# Patient Record
Sex: Female | Born: 2000 | Race: White | Hispanic: No | Marital: Single | Smoking: Never smoker
Health system: Southern US, Community
[De-identification: ages and names within clinical notes are randomized; demographics above are authoritative.]

## PROBLEM LIST (undated history)

## (undated) ENCOUNTER — Ambulatory Visit: Admission: EM

## (undated) DIAGNOSIS — R519 Headache, unspecified: Secondary | ICD-10-CM

## (undated) DIAGNOSIS — R51 Headache: Secondary | ICD-10-CM

## (undated) DIAGNOSIS — Z789 Other specified health status: Secondary | ICD-10-CM

## (undated) DIAGNOSIS — G43909 Migraine, unspecified, not intractable, without status migrainosus: Secondary | ICD-10-CM

## (undated) DIAGNOSIS — IMO0001 Reserved for inherently not codable concepts without codable children: Secondary | ICD-10-CM

## (undated) HISTORY — DX: Migraine, unspecified, not intractable, without status migrainosus: G43.909

---
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
## 2020-03-25 ENCOUNTER — Other Ambulatory Visit: Payer: Self-pay

## 2020-03-25 ENCOUNTER — Ambulatory Visit (INDEPENDENT_AMBULATORY_CARE_PROVIDER_SITE_OTHER): Payer: BC Managed Care – PPO | Admitting: Family Medicine

## 2020-03-25 ENCOUNTER — Encounter: Payer: Self-pay | Admitting: Family Medicine

## 2020-03-25 VITALS — BP 111/72 | HR 77 | Temp 98.6°F | Ht 69.3 in | Wt 231.6 lb

## 2020-03-25 DIAGNOSIS — Z13220 Encounter for screening for lipoid disorders: Secondary | ICD-10-CM | POA: Diagnosis not present

## 2020-03-25 DIAGNOSIS — Z111 Encounter for screening for respiratory tuberculosis: Secondary | ICD-10-CM | POA: Diagnosis not present

## 2020-03-25 DIAGNOSIS — Z862 Personal history of diseases of the blood and blood-forming organs and certain disorders involving the immune mechanism: Secondary | ICD-10-CM

## 2020-03-25 DIAGNOSIS — G43009 Migraine without aura, not intractable, without status migrainosus: Secondary | ICD-10-CM | POA: Diagnosis not present

## 2020-03-25 MED ORDER — SUMATRIPTAN SUCCINATE 100 MG PO TABS: 100.0000 mg | ORAL_TABLET | Freq: Every day | ORAL | 6 refills | Status: DC

## 2020-03-25 MED ORDER — TROKENDI XR 100 MG PO CP24: 100.0000 mg | ORAL_CAPSULE | Freq: Every day | ORAL | 3 refills | Status: DC

## 2020-03-25 NOTE — Assessment & Plan Note (Signed)
Chronic for the past few years. Continues with weekly migraines now with blurred vision, nausea and vomiting- worse than when they started. Not under good control on trokendi and imitrex. Will increase dose of both and recheck 1 month. Will obtain MRI of head to r/o mass or other issue given change in migraine. Call with any concerns. Continue to monitor.

## 2020-03-25 NOTE — Progress Notes (Signed)
BP 111/72   Pulse 77   Temp 98.6 F (37 C) (Oral)   Ht 5' 9.3" (1.76 m)   Wt 231 lb 9.6 oz (105.1 kg)   LMP 03/10/2020 (Approximate)   SpO2 98%   BMI 33.91 kg/m    Subjective:    Patient ID: Deborah Summers, female    DOB: [birth year], 19 y.o.   MRN: [id number]  HPI: Deborah Summers is a 19 y.o. female who presents today to establish care.   Chief Complaint  Patient presents with  . Migraine  . Establish Care   MIGRAINES- was on topiramate, then changed to trokendi over a year, just upped it about 3 months ago to the 50mg , has tingling in her hands and occasionally spasms in her hands, usually just the R hand, no pain Duration: 2.5 years Onset: sudden Severity: severe Quality: sharp, stabbing Frequency: 1x a week Location: L temple Headache duration: Radiation: no Time of day headache occurs: at random  Headache status at time of visit: asymptomatic Treatments attempted: Treatments attempted: rest, ice, heat, APAP, ibuprofen, aleve", excedrine, triptans and topamax   Aura: no Nausea:  yes Vomiting: yes Photophobia:  yes Phonophobia:  no Effect on social functioning:  yes Confusion:  no Gait disturbance/ataxia:  no Behavioral changes:  no Fevers:  no  Active Ambulatory Problems    Diagnosis Date Noted  . Migraine without aura 03/25/2020   Resolved Ambulatory Problems    Diagnosis Date Noted  . No Resolved Ambulatory Problems   Past Medical History:  Diagnosis Date  . Cold   . Headache   . Migraine   . Patient denies medical problems    Past Surgical History:  Procedure Laterality Date  . TONSILLECTOMY AND ADENOIDECTOMY Bilateral 10/28/2015   Procedure: TONSILLECTOMY AND ADENOIDECTOMY;  Surgeon: 10/30/2015, MD;  Location: [facility];  Service: ENT;  Laterality: Bilateral;   Outpatient Encounter Medications as of 03/25/2020  Medication Sig  . promethazine (PHENERGAN) 25 MG tablet Take 25 mg by mouth every 6 (six) hours as needed for nausea  or vomiting.  . SPRINTEC 28 0.25-35 MG-MCG tablet Take 1 tablet by mouth daily.  . SUMAtriptan (IMITREX) 100 MG tablet Take 1 tablet (100 mg total) by mouth daily. May repeat in 2 hours if headache persists or recurs.  . [DISCONTINUED] SUMAtriptan (IMITREX) 50 MG tablet SMARTSIG:1 Tablet(s) By Mouth 1 to 2 Times Daily  . [DISCONTINUED] TROKENDI XR 50 MG CP24 Take 1 capsule by mouth daily.  . Topiramate ER (TROKENDI XR) 100 MG CP24 Take 100 mg by mouth daily.  . [DISCONTINUED] HYDROcodone-acetaminophen (HYCET) 7.5-325 mg/15 ml solution Take 10 mLs by mouth every 4 (four) hours as needed for moderate pain.   No facility-administered encounter medications on file as of 03/25/2020.   Social History   Socioeconomic History  . Marital status: Single    Spouse name: Not on file  . Number of children: Not on file  . Years of education: Not on file  . Highest education level: Not on file  Occupational History  . Not on file  Tobacco Use  . Smoking status: Never Smoker  . Smokeless tobacco: Never Used  Vaping Use  . Vaping Use: Never used  Substance and Sexual Activity  . Alcohol use: No  . Drug use: No  . Sexual activity: Not Currently  Other Topics Concern  . Not on file  Social History Narrative  . Not on file   Social Determinants of Health  Financial Resource Strain:   . Difficulty of Paying Living Expenses: Not on file  Food Insecurity:   . Worried About Programme researcher, broadcasting/film/video in the Last Year: Not on file  . Ran Out of Food in the Last Year: Not on file  Transportation Needs:   . Lack of Transportation (Medical): Not on file  . Lack of Transportation (Non-Medical): Not on file  Physical Activity:   . Days of Exercise per Week: Not on file  . Minutes of Exercise per Session: Not on file  Stress:   . Feeling of Stress : Not on file  Social Connections:   . Frequency of Communication with Friends and Family: Not on file  . Frequency of Social Gatherings with Friends and  Family: Not on file  . Attends Religious Services: Not on file  . Active Member of Clubs or Organizations: Not on file  . Attends Banker Meetings: Not on file  . Marital Status: Not on file   Family History  Problem Relation Age of Onset  . Hypertension Mother   . Heart disease Father   . Anxiety disorder Brother   . Uterine cancer Maternal Grandmother   . Heart disease Maternal Grandfather   . Multiple sclerosis Paternal Grandmother   . Heart disease Paternal Grandfather    Review of Systems  Constitutional: Negative.   Respiratory: Negative.   Cardiovascular: Negative.   Gastrointestinal: Positive for nausea. Negative for abdominal distention, abdominal pain, anal bleeding, blood in stool, constipation, diarrhea, rectal pain and vomiting.  Musculoskeletal: Negative.   Neurological: Positive for headaches. Negative for dizziness, tremors, seizures, syncope, facial asymmetry, speech difficulty, weakness, light-headedness and numbness.  Psychiatric/Behavioral: Negative.     Per HPI unless specifically indicated above     Objective:    BP 111/72   Pulse 77   Temp 98.6 F (37 C) (Oral)   Ht 5' 9.3" (1.76 m)   Wt 231 lb 9.6 oz (105.1 kg)   LMP 03/10/2020 (Approximate)   SpO2 98%   BMI 33.91 kg/m   Wt Readings from Last 3 Encounters:  03/25/20 231 lb 9.6 oz (105.1 kg) (99 %, Z= 2.32)*  09/17/17 225 lb (102.1 kg) (99 %, Z= 2.30)*  10/28/15 226 lb (102.5 kg) (>99 %, Z= 2.53)*   * Growth percentiles are based on CDC (Girls, 2-20 Years) data.    Physical Exam Vitals and nursing note reviewed.  Constitutional:      General: She is not in acute distress.    Appearance: Normal appearance. She is not ill-appearing, toxic-appearing or diaphoretic.  HENT:     Head: Normocephalic and atraumatic.     Right Ear: External ear normal.     Left Ear: External ear normal.     Nose: Nose normal.     Mouth/Throat:     Mouth: Mucous membranes are moist.     Pharynx:  Oropharynx is clear.  Eyes:     General: No scleral icterus.       Right eye: No discharge.        Left eye: No discharge.     Extraocular Movements: Extraocular movements intact.     Conjunctiva/sclera: Conjunctivae normal.     Pupils: Pupils are equal, round, and reactive to light.  Cardiovascular:     Rate and Rhythm: Normal rate and regular rhythm.     Pulses: Normal pulses.     Heart sounds: Normal heart sounds. No murmur heard.  No friction rub. No gallop.  Pulmonary:     Effort: Pulmonary effort is normal. No respiratory distress.     Breath sounds: Normal breath sounds. No stridor. No wheezing, rhonchi or rales.  Chest:     Chest wall: No tenderness.  Musculoskeletal:        General: Normal range of motion.     Cervical back: Normal range of motion and neck supple.  Skin:    General: Skin is warm and dry.     Capillary Refill: Capillary refill takes less than 2 seconds.     Coloration: Skin is not jaundiced or pale.     Findings: No bruising, erythema, lesion or rash.  Neurological:     General: No focal deficit present.     Mental Status: She is alert and oriented to person, place, and time. Mental status is at baseline.  Psychiatric:        Mood and Affect: Mood normal.        Behavior: Behavior normal.        Thought Content: Thought content normal.        Judgment: Judgment normal.     Results for orders placed or performed during the hospital encounter of 02/24/16  Pregnancy, urine  Result Value Ref Range   Preg Test, Ur NEGATIVE NEGATIVE      Assessment & Plan:   Problem List Items Addressed This Visit      Cardiovascular and Mediastinum   Migraine without aura - Primary    Chronic for the past few years. Continues with weekly migraines now with blurred vision, nausea and vomiting- worse than when they started. Not under good control on trokendi and imitrex. Will increase dose of both and recheck 1 month. Will obtain MRI of head to r/o mass or other  issue given change in migraine. Call with any concerns. Continue to monitor.       Relevant Medications   SUMAtriptan (IMITREX) 100 MG tablet   Topiramate ER (TROKENDI XR) 100 MG CP24   Other Relevant Orders   Comprehensive metabolic panel   TSH   MR Brain Wo Contrast    Other Visit Diagnoses    History of anemia       Labs drawn today. Await results.    Relevant Orders   CBC with Differential/Platelet   Iron and TIBC   Ferritin   Screening for cholesterol level       Labs drawn today. Await results.    Relevant Orders   Lipid Panel w/o Chol/HDL Ratio   Screening for tuberculosis       Labs drawn today. Await results.    Relevant Orders   QuantiFERON-TB Gold Plus       Follow up plan: Return in about 4 weeks (around 04/22/2020).

## 2020-03-29 ENCOUNTER — Encounter: Payer: Self-pay | Admitting: Family Medicine

## 2020-03-29 LAB — CBC WITH DIFFERENTIAL/PLATELET
Basophils Absolute: 0 10*3/uL (ref 0.0–0.2)
Basos: 1 %
EOS (ABSOLUTE): 0.1 10*3/uL (ref 0.0–0.4)
Eos: 1 %
Hematocrit: 42.4 % (ref 34.0–46.6)
Hemoglobin: 14.3 g/dL (ref 11.1–15.9)
Immature Grans (Abs): 0 10*3/uL (ref 0.0–0.1)
Immature Granulocytes: 0 %
Lymphocytes Absolute: 1.8 10*3/uL (ref 0.7–3.1)
Lymphs: 31 %
MCH: 30.3 pg (ref 26.6–33.0)
MCHC: 33.7 g/dL (ref 31.5–35.7)
MCV: 90 fL (ref 79–97)
Monocytes Absolute: 0.3 10*3/uL (ref 0.1–0.9)
Monocytes: 6 %
Neutrophils Absolute: 3.4 10*3/uL (ref 1.4–7.0)
Neutrophils: 61 %
Platelets: 196 10*3/uL (ref 150–450)
RBC: 4.72 x10E6/uL (ref 3.77–5.28)
RDW: 13.3 % (ref 11.7–15.4)
WBC: 5.6 10*3/uL (ref 3.4–10.8)

## 2020-03-29 LAB — TSH: TSH: 0.907 u[IU]/mL (ref 0.450–4.500)

## 2020-03-29 LAB — IRON AND TIBC
Iron Saturation: 25 % (ref 15–55)
Iron: 99 ug/dL (ref 27–159)
Total Iron Binding Capacity: 396 ug/dL (ref 250–450)
UIBC: 297 ug/dL (ref 131–425)

## 2020-03-29 LAB — COMPREHENSIVE METABOLIC PANEL
ALT: 10 IU/L (ref 0–32)
AST: 10 IU/L (ref 0–40)
Albumin/Globulin Ratio: 1.5 (ref 1.2–2.2)
Albumin: 4.2 g/dL (ref 3.9–5.0)
Alkaline Phosphatase: 72 IU/L (ref 45–106)
BUN/Creatinine Ratio: 9 (ref 9–23)
BUN: 7 mg/dL (ref 6–20)
Bilirubin Total: 0.4 mg/dL (ref 0.0–1.2)
CO2: 21 mmol/L (ref 20–29)
Calcium: 9.5 mg/dL (ref 8.7–10.2)
Chloride: 107 mmol/L — ABNORMAL HIGH (ref 96–106)
Creatinine, Ser: 0.76 mg/dL (ref 0.57–1.00)
GFR calc Af Amer: 132 mL/min/{1.73_m2} (ref >59)
GFR calc non Af Amer: 114 mL/min/{1.73_m2} (ref >59)
Globulin, Total: 2.8 g/dL (ref 1.5–4.5)
Glucose: 82 mg/dL (ref 65–99)
Potassium: 4 mmol/L (ref 3.5–5.2)
Sodium: 139 mmol/L (ref 134–144)
Total Protein: 7 g/dL (ref 6.0–8.5)

## 2020-03-29 LAB — LIPID PANEL W/O CHOL/HDL RATIO
Cholesterol, Total: 186 mg/dL — ABNORMAL HIGH (ref 100–169)
HDL: 53 mg/dL (ref >39)
LDL Chol Calc (NIH): 93 mg/dL (ref 0–109)
Triglycerides: 237 mg/dL — ABNORMAL HIGH (ref 0–89)
VLDL Cholesterol Cal: 40 mg/dL (ref 5–40)

## 2020-03-29 LAB — QUANTIFERON-TB GOLD PLUS
QuantiFERON Mitogen Value: >10 IU/mL
QuantiFERON Nil Value: 0 IU/mL
QuantiFERON TB1 Ag Value: 0.02 IU/mL
QuantiFERON TB2 Ag Value: 0.01 IU/mL
QuantiFERON-TB Gold Plus: NEGATIVE

## 2020-03-29 LAB — FERRITIN: Ferritin: 51 ng/mL (ref 15–77)

## 2020-04-07 ENCOUNTER — Other Ambulatory Visit: Payer: Self-pay

## 2020-04-07 ENCOUNTER — Ambulatory Visit
Admission: RE | Admit: 2020-04-07 | Discharge: 2020-04-07 | Disposition: A | Discharge status: Home / Self Care | Payer: BC Managed Care – PPO | Source: Ambulatory Visit | Attending: Family Medicine | Admitting: Family Medicine

## 2020-04-07 DIAGNOSIS — G43009 Migraine without aura, not intractable, without status migrainosus: Secondary | ICD-10-CM | POA: Insufficient documentation

## 2020-04-12 ENCOUNTER — Encounter: Payer: Self-pay | Admitting: Family Medicine

## 2020-04-22 ENCOUNTER — Ambulatory Visit (INDEPENDENT_AMBULATORY_CARE_PROVIDER_SITE_OTHER): Payer: BC Managed Care – PPO | Admitting: Family Medicine

## 2020-04-22 ENCOUNTER — Other Ambulatory Visit: Payer: Self-pay

## 2020-04-22 ENCOUNTER — Encounter: Payer: Self-pay | Admitting: Family Medicine

## 2020-04-22 VITALS — BP 114/77 | HR 72 | Temp 98.2°F | Wt 229.0 lb

## 2020-04-22 DIAGNOSIS — Z23 Encounter for immunization: Secondary | ICD-10-CM | POA: Diagnosis not present

## 2020-04-22 DIAGNOSIS — G43009 Migraine without aura, not intractable, without status migrainosus: Secondary | ICD-10-CM

## 2020-04-22 MED ORDER — NORTRIPTYLINE HCL 25 MG PO CAPS: 25.0000 mg | ORAL_CAPSULE | Freq: Every day | ORAL | 3 refills | Status: DC

## 2020-04-22 NOTE — Assessment & Plan Note (Signed)
No better on higher dose of the trokendi. Will add nortriptyline and recheck 3 weeks. Call with any concerns. Continue to monitor.

## 2020-04-22 NOTE — Progress Notes (Signed)
BP 114/77   Pulse 72   Temp 98.2 F (36.8 C) (Oral)   Wt 229 lb (103.9 kg)   SpO2 99%   BMI 33.53 kg/m    Subjective:    Patient ID: Deborah Summers, female    DOB: 2000/11/08, 19 y.o.   MRN: [id number]  HPI: Deborah M Bonnet is a 19 y.o. female  Chief Complaint  Patient presents with  . Migraine   MIGRAINES Duration: chronic Onset: sudden Severity: severe Quality: sharp and severe Frequency: 1-2 x a week Location: behind her eye Headache duration: 6+ hours Radiation: no Time of day headache occurs: at random Headache status at time of visit: asymptomatic Treatments attempted: Treatments attempted: rest, ice, heat, APAP, ibuprofen, aleve", excedrine, triptans and topamax   Aura: no Nausea:  yes Vomiting: yes Photophobia:  yes Phonophobia:  yes Effect on social functioning:  yes Confusion:  no Gait disturbance/ataxia:  no Behavioral changes:  no Fevers:  no  Relevant past medical, surgical, family and social history reviewed and updated as indicated. Interim medical history since our last visit reviewed. Allergies and medications reviewed and updated.  Review of Systems  Constitutional: Negative.   Respiratory: Negative.   Cardiovascular: Negative.   Gastrointestinal: Negative.   Musculoskeletal: Negative.   Neurological: Positive for headaches. Negative for dizziness, tremors, seizures, syncope, facial asymmetry, speech difficulty, weakness, light-headedness and numbness.  Psychiatric/Behavioral: Negative.     Per HPI unless specifically indicated above     Objective:    BP 114/77   Pulse 72   Temp 98.2 F (36.8 C) (Oral)   Wt 229 lb (103.9 kg)   SpO2 99%   BMI 33.53 kg/m   Wt Readings from Last 3 Encounters:  04/22/20 229 lb (103.9 kg) (99 %, Z= 2.30)*  03/25/20 231 lb 9.6 oz (105.1 kg) (99 %, Z= 2.32)*  09/17/17 225 lb (102.1 kg) (99 %, Z= 2.30)*   * Growth percentiles are based on CDC (Girls, 2-20 Years) data.    Physical Exam Vitals and  nursing note reviewed.  Constitutional:      General: She is not in acute distress.    Appearance: Normal appearance. She is not ill-appearing, toxic-appearing or diaphoretic.  HENT:     Head: Normocephalic and atraumatic.     Right Ear: External ear normal.     Left Ear: External ear normal.     Nose: Nose normal.     Mouth/Throat:     Mouth: Mucous membranes are moist.     Pharynx: Oropharynx is clear.  Eyes:     General: No scleral icterus.       Right eye: No discharge.        Left eye: No discharge.     Extraocular Movements: Extraocular movements intact.     Conjunctiva/sclera: Conjunctivae normal.     Pupils: Pupils are equal, round, and reactive to light.  Cardiovascular:     Rate and Rhythm: Normal rate and regular rhythm.     Pulses: Normal pulses.     Heart sounds: Normal heart sounds. No murmur heard.  No friction rub. No gallop.   Pulmonary:     Effort: Pulmonary effort is normal. No respiratory distress.     Breath sounds: Normal breath sounds. No stridor. No wheezing, rhonchi or rales.  Chest:     Chest wall: No tenderness.  Musculoskeletal:        General: Normal range of motion.     Cervical back: Normal range of motion  and neck supple.  Skin:    General: Skin is warm and dry.     Capillary Refill: Capillary refill takes less than 2 seconds.     Coloration: Skin is not jaundiced or pale.     Findings: No bruising, erythema, lesion or rash.  Neurological:     General: No focal deficit present.     Mental Status: She is alert and oriented to person, place, and time. Mental status is at baseline.  Psychiatric:        Mood and Affect: Mood normal.        Behavior: Behavior normal.        Thought Content: Thought content normal.        Judgment: Judgment normal.     Results for orders placed or performed in visit on 03/25/20  CBC with Differential/Platelet  Result Value Ref Range   WBC 5.6 3.4 - 10.8 x10E3/uL   RBC 4.72 3.77 - 5.28 x10E6/uL    Hemoglobin 14.3 11.1 - 15.9 g/dL   Hematocrit 93.7 16.9 - 46.6 %   MCV 90 79 - 97 fL   MCH 30.3 26.6 - 33.0 pg   MCHC 33.7 31 - 35 g/dL   RDW 67.8 93.8 - 10.1 %   Platelets 196 150 - 450 x10E3/uL   Neutrophils 61 Not Estab. %   Lymphs 31 Not Estab. %   Monocytes 6 Not Estab. %   Eos 1 Not Estab. %   Basos 1 Not Estab. %   Neutrophils Absolute 3.4 1 - 7 x10E3/uL   Lymphocytes Absolute 1.8 0 - 3 x10E3/uL   Monocytes Absolute 0.3 0 - 0 x10E3/uL   EOS (ABSOLUTE) 0.1 0.0 - 0.4 x10E3/uL   Basophils Absolute 0.0 0 - 0 x10E3/uL   Immature Granulocytes 0 Not Estab. %   Immature Grans (Abs) 0.0 0.0 - 0.1 x10E3/uL  Comprehensive metabolic panel  Result Value Ref Range   Glucose 82 65 - 99 mg/dL   BUN 7 6 - 20 mg/dL   Creatinine, Ser 7.51 0.57 - 1.00 mg/dL   GFR calc non Af Amer 114 >59 mL/min/1.73   GFR calc Af Amer 132 >59 mL/min/1.73   BUN/Creatinine Ratio 9 9 - 23   Sodium 139 134 - 144 mmol/L   Potassium 4.0 3.5 - 5.2 mmol/L   Chloride 107 (H) 96 - 106 mmol/L   CO2 21 20 - 29 mmol/L   Calcium 9.5 8.7 - 10.2 mg/dL   Total Protein 7.0 6.0 - 8.5 g/dL   Albumin 4.2 3.9 - 5.0 g/dL   Globulin, Total 2.8 1.5 - 4.5 g/dL   Albumin/Globulin Ratio 1.5 1.2 - 2.2   Bilirubin Total 0.4 0.0 - 1.2 mg/dL   Alkaline Phosphatase 72 45 - 106 IU/L   AST 10 0 - 40 IU/L   ALT 10 0 - 32 IU/L  Lipid Panel w/o Chol/HDL Ratio  Result Value Ref Range   Cholesterol, Total 186 (H) 100 - 169 mg/dL   Triglycerides 025 (H) 0 - 89 mg/dL   HDL 53 >85 mg/dL   VLDL Cholesterol Cal 40 5 - 40 mg/dL   LDL Chol Calc (NIH) 93 0 - 109 mg/dL  TSH  Result Value Ref Range   TSH 0.907 0.450 - 4.500 uIU/mL  Iron and TIBC  Result Value Ref Range   Total Iron Binding Capacity 396 250 - 450 ug/dL   UIBC 277 824 - 235 ug/dL   Iron 99 27 - 361 ug/dL  Iron Saturation 25 15 - 55 %  Ferritin  Result Value Ref Range   Ferritin 51 15.0 - 77.0 ng/mL  QuantiFERON-TB Gold Plus  Result Value Ref Range   QuantiFERON  Incubation Incubation performed.    QuantiFERON Criteria Comment    QuantiFERON TB1 Ag Value 0.02 IU/mL   QuantiFERON TB2 Ag Value 0.01 IU/mL   QuantiFERON Nil Value 0.00 IU/mL   QuantiFERON Mitogen Value >10.00 IU/mL   QuantiFERON-TB Gold Plus Negative Negative      Assessment & Plan:   Problem List Items Addressed This Visit      Cardiovascular and Mediastinum   Migraine without aura - Primary    No better on higher dose of the trokendi. Will add nortriptyline and recheck 3 weeks. Call with any concerns. Continue to monitor.       Relevant Medications   nortriptyline (PAMELOR) 25 MG capsule    Other Visit Diagnoses    Flu vaccine need       Flu shot given today.   Relevant Orders   Flu Vaccine QUAD 36+ mos IM (Completed)       Follow up plan: Return in about 3 weeks (around 05/13/2020).

## 2020-05-04 ENCOUNTER — Encounter: Payer: Self-pay | Admitting: Family Medicine

## 2020-05-04 ENCOUNTER — Other Ambulatory Visit: Payer: Self-pay | Admitting: Family Medicine

## 2020-05-04 MED ORDER — SPRINTEC 28 0.25-35 MG-MCG PO TABS: 1.0000 | ORAL_TABLET | Freq: Every day | ORAL | 11 refills | Status: DC

## 2020-05-19 ENCOUNTER — Ambulatory Visit: Payer: BC Managed Care – PPO | Admitting: Family Medicine

## 2020-05-31 ENCOUNTER — Other Ambulatory Visit: Payer: Self-pay

## 2020-05-31 ENCOUNTER — Encounter: Payer: Self-pay | Admitting: Family Medicine

## 2020-05-31 ENCOUNTER — Ambulatory Visit (INDEPENDENT_AMBULATORY_CARE_PROVIDER_SITE_OTHER): Payer: BC Managed Care – PPO | Admitting: Family Medicine

## 2020-05-31 DIAGNOSIS — G43009 Migraine without aura, not intractable, without status migrainosus: Secondary | ICD-10-CM

## 2020-05-31 MED ORDER — AIMOVIG 70 MG/ML ~~LOC~~ SOAJ: 70.0000 mg | SUBCUTANEOUS | 3 refills | Status: DC

## 2020-05-31 NOTE — Assessment & Plan Note (Signed)
Still not doing well. Has failed nortriptyline, topamax and trokendi. Cannot use beta blocker due to soft BP. Will start aimovig and recheck 1 month. Call with any concerns.

## 2020-05-31 NOTE — Patient Instructions (Signed)
Erenumab injection What is this medicine? ERENUMAB (e REN ue mab) is used to prevent migraine headaches. This medicine may be used for other purposes; ask your health care provider or pharmacist if you have questions. COMMON BRAND NAME(S): Aimovig What should I tell my health care provider before I take this medicine? They need to know if you have any of these conditions:  an unusual or allergic reaction to erenumab, latex, other medicines, foods, dyes, or preservatives  high blood pressure  pregnant or trying to get pregnant  breast-feeding How should I use this medicine? This medicine is for injection under the skin. You will be taught how to prepare and give this medicine. Use exactly as directed. Take your medicine at regular intervals. Do not take your medicine more often than directed. It is important that you put your used needles and syringes in a special sharps container. Do not put them in a trash can. If you do not have a sharps container, call your pharmacist or healthcare provider to get one. Talk to your pediatrician regarding the use of this medicine in children. Special care may be needed. Overdosage: If you think you have taken too much of this medicine contact a poison control center or emergency room at once. NOTE: This medicine is only for you. Do not share this medicine with others. What if I miss a dose? If you miss a dose, take it as soon as you can. If it is almost time for your next dose, take only that dose. Do not take double or extra doses. What may interact with this medicine? Interactions are not expected. This list may not describe all possible interactions. Give your health care provider a list of all the medicines, herbs, non-prescription drugs, or dietary supplements you use. Also tell them if you smoke, drink alcohol, or use illegal drugs. Some items may interact with your medicine. What should I watch for while using this medicine? Tell your doctor or  healthcare professional if your symptoms do not start to get better or if they get worse. What side effects may I notice from receiving this medicine? Side effects that you should report to your doctor or health care professional as soon as possible:  allergic reactions like skin rash, itching or hives, swelling of the face, lips, or tongue  chest pain  fast, irregular heartbeat  feeling faint or lightheaded  palpitations Side effects that usually do not require medical attention (report these to your doctor or health care professional if they continue or are bothersome):  constipation  muscle cramps  pain, redness, or irritation at site where injected This list may not describe all possible side effects. Call your doctor for medical advice about side effects. You may report side effects to FDA at 1-800-FDA-1088. Where should I keep my medicine? Keep out of the reach of children. You will be instructed on how to store this medicine. Throw away any unused medicine after the expiration date on the label. NOTE: This sheet is a summary. It may not cover all possible information. If you have questions about this medicine, talk to your doctor, pharmacist, or health care provider.  2020 Elsevier/Gold Standard (2018-12-08 15:43:58)  

## 2020-05-31 NOTE — Progress Notes (Signed)
BP 126/82   Pulse 100   Temp 98.4 F (36.9 C) (Oral)   Ht 5' 9.3" (1.76 m)   Wt 234 lb (106.1 kg)   SpO2 100%   BMI 34.26 kg/m    Subjective:    Patient ID: Deborah Summers, female    DOB: 2001/06/02, 19 y.o.   MRN: [id number]  HPI: Deborah Summers is a 19 y.o. female  Chief Complaint  Patient presents with  . Migraine    follow up.   She notes that she's getting her migraines less frequently, about 1x a week. But they are lasting longer and staying there for about 24 hours.   MIGRAINES Duration: chronic Onset: sudden Severity: severe Quality: sharp and throbbing Frequency: about 1x a week Location: behind her eye Headache duration: 24 hours Radiation: no Time of day headache occurs: at random Headache status at time of visit: asymptomatic Treatments attempted: Treatments attempted: nortriptyline, rest, ice, heat, APAP, ibuprofen, aleve", excedrine, triptans and topamax   Aura: no Nausea:  yes Vomiting: yes Photophobia:  yes Phonophobia:  yes Effect on social functioning:  yes Confusion:  no Gait disturbance/ataxia:  no Behavioral changes:  no Fevers:  no   Relevant past medical, surgical, family and social history reviewed and updated as indicated. Interim medical history since our last visit reviewed. Allergies and medications reviewed and updated.  Review of Systems  Constitutional: Negative.   HENT: Negative.   Respiratory: Negative.   Cardiovascular: Negative.   Musculoskeletal: Negative.   Neurological: Positive for headaches. Negative for dizziness, tremors, seizures, syncope, facial asymmetry, speech difficulty, weakness, light-headedness and numbness.  Psychiatric/Behavioral: Negative.     Per HPI unless specifically indicated above     Objective:    BP 126/82   Pulse 100   Temp 98.4 F (36.9 C) (Oral)   Ht 5' 9.3" (1.76 m)   Wt 234 lb (106.1 kg)   SpO2 100%   BMI 34.26 kg/m   Wt Readings from Last 3 Encounters:  05/31/20 234 lb  (106.1 kg) (>99 %, Z= 2.35)*  04/22/20 229 lb (103.9 kg) (99 %, Z= 2.30)*  03/25/20 231 lb 9.6 oz (105.1 kg) (99 %, Z= 2.32)*   * Growth percentiles are based on CDC (Girls, 2-20 Years) data.    Physical Exam Vitals and nursing note reviewed.  Constitutional:      General: She is not in acute distress.    Appearance: Normal appearance. She is not ill-appearing, toxic-appearing or diaphoretic.  HENT:     Head: Normocephalic and atraumatic.     Right Ear: External ear normal.     Left Ear: External ear normal.     Nose: Nose normal.     Mouth/Throat:     Mouth: Mucous membranes are moist.     Pharynx: Oropharynx is clear.  Eyes:     General: No scleral icterus.       Right eye: No discharge.        Left eye: No discharge.     Extraocular Movements: Extraocular movements intact.     Conjunctiva/sclera: Conjunctivae normal.     Pupils: Pupils are equal, round, and reactive to light.  Cardiovascular:     Rate and Rhythm: Normal rate and regular rhythm.     Pulses: Normal pulses.     Heart sounds: Normal heart sounds. No murmur heard.  No friction rub. No gallop.   Pulmonary:     Effort: Pulmonary effort is normal. No respiratory distress.  Breath sounds: Normal breath sounds. No stridor. No wheezing, rhonchi or rales.  Chest:     Chest wall: No tenderness.  Musculoskeletal:        General: Normal range of motion.     Cervical back: Normal range of motion and neck supple.  Skin:    General: Skin is warm and dry.     Capillary Refill: Capillary refill takes less than 2 seconds.     Coloration: Skin is not jaundiced or pale.     Findings: No bruising, erythema, lesion or rash.  Neurological:     General: No focal deficit present.     Mental Status: She is alert and oriented to person, place, and time. Mental status is at baseline.  Psychiatric:        Mood and Affect: Mood normal.        Behavior: Behavior normal.        Thought Content: Thought content normal.         Judgment: Judgment normal.     Results for orders placed or performed in visit on 03/25/20  CBC with Differential/Platelet  Result Value Ref Range   WBC 5.6 3.4 - 10.8 x10E3/uL   RBC 4.72 3.77 - 5.28 x10E6/uL   Hemoglobin 14.3 11.1 - 15.9 g/dL   Hematocrit 51.8 84.1 - 46.6 %   MCV 90 79 - 97 fL   MCH 30.3 26.6 - 33.0 pg   MCHC 33.7 31 - 35 g/dL   RDW 66.0 63.0 - 16.0 %   Platelets 196 150 - 450 x10E3/uL   Neutrophils 61 Not Estab. %   Lymphs 31 Not Estab. %   Monocytes 6 Not Estab. %   Eos 1 Not Estab. %   Basos 1 Not Estab. %   Neutrophils Absolute 3.4 1.40 - 7.00 x10E3/uL   Lymphocytes Absolute 1.8 0 - 3 x10E3/uL   Monocytes Absolute 0.3 0 - 0 x10E3/uL   EOS (ABSOLUTE) 0.1 0.0 - 0.4 x10E3/uL   Basophils Absolute 0.0 0 - 0 x10E3/uL   Immature Granulocytes 0 Not Estab. %   Immature Grans (Abs) 0.0 0.0 - 0.1 x10E3/uL  Comprehensive metabolic panel  Result Value Ref Range   Glucose 82 65 - 99 mg/dL   BUN 7 6 - 20 mg/dL   Creatinine, Ser 1.09 0.57 - 1.00 mg/dL   GFR calc non Af Amer 114 >59 mL/min/1.73   GFR calc Af Amer 132 >59 mL/min/1.73   BUN/Creatinine Ratio 9 9 - 23   Sodium 139 134 - 144 mmol/L   Potassium 4.0 3.5 - 5.2 mmol/L   Chloride 107 (H) 96 - 106 mmol/L   CO2 21 20 - 29 mmol/L   Calcium 9.5 8.7 - 10.2 mg/dL   Total Protein 7.0 6.0 - 8.5 g/dL   Albumin 4.2 3.9 - 5.0 g/dL   Globulin, Total 2.8 1.5 - 4.5 g/dL   Albumin/Globulin Ratio 1.5 1.2 - 2.2   Bilirubin Total 0.4 0.0 - 1.2 mg/dL   Alkaline Phosphatase 72 45 - 106 IU/L   AST 10 0 - 40 IU/L   ALT 10 0 - 32 IU/L  Lipid Panel w/o Chol/HDL Ratio  Result Value Ref Range   Cholesterol, Total 186 (H) 100 - 169 mg/dL   Triglycerides 323 (H) 0 - 89 mg/dL   HDL 53 >55 mg/dL   VLDL Cholesterol Cal 40 5 - 40 mg/dL   LDL Chol Calc (NIH) 93 0 - 109 mg/dL  TSH  Result Value Ref  Range   TSH 0.907 0.450 - 4.500 uIU/mL  Iron and TIBC  Result Value Ref Range   Total Iron Binding Capacity 396 250 - 450 ug/dL    UIBC 476 546 - 503 ug/dL   Iron 99 27 - 546 ug/dL   Iron Saturation 25 15 - 55 %  Ferritin  Result Value Ref Range   Ferritin 51 15.0 - 77.0 ng/mL  QuantiFERON-TB Gold Plus  Result Value Ref Range   QuantiFERON Incubation Incubation performed.    QuantiFERON Criteria Comment    QuantiFERON TB1 Ag Value 0.02 IU/mL   QuantiFERON TB2 Ag Value 0.01 IU/mL   QuantiFERON Nil Value 0.00 IU/mL   QuantiFERON Mitogen Value >10.00 IU/mL   QuantiFERON-TB Gold Plus Negative Negative      Assessment & Plan:   Problem List Items Addressed This Visit      Cardiovascular and Mediastinum   Migraine without aura    Still not doing well. Has failed nortriptyline, topamax and trokendi. Cannot use beta blocker due to soft BP. Will start aimovig and recheck 1 month. Call with any concerns.       Relevant Medications   Erenumab-aooe (AIMOVIG) 70 MG/ML SOAJ       Follow up plan: Return in about 4 weeks (around 06/28/2020).

## 2020-06-28 ENCOUNTER — Encounter: Payer: Self-pay | Admitting: Family Medicine

## 2020-06-28 ENCOUNTER — Ambulatory Visit (INDEPENDENT_AMBULATORY_CARE_PROVIDER_SITE_OTHER): Payer: BC Managed Care – PPO | Admitting: Family Medicine

## 2020-06-28 ENCOUNTER — Other Ambulatory Visit: Payer: Self-pay

## 2020-06-28 DIAGNOSIS — G43009 Migraine without aura, not intractable, without status migrainosus: Secondary | ICD-10-CM

## 2020-06-28 MED ORDER — SUMATRIPTAN SUCCINATE 100 MG PO TABS: 100.0000 mg | ORAL_TABLET | Freq: Every day | ORAL | 12 refills | Status: DC

## 2020-06-28 MED ORDER — AIMOVIG 70 MG/ML ~~LOC~~ SOAJ: 70.0000 mg | SUBCUTANEOUS | 3 refills | Status: DC

## 2020-06-28 MED ORDER — TROKENDI XR 100 MG PO CP24: 100.0000 mg | ORAL_CAPSULE | Freq: Every day | ORAL | 1 refills | Status: DC

## 2020-06-28 NOTE — Assessment & Plan Note (Signed)
Improved. Discussed increasing her aimovig to 140mg  daily vs monitoring. She would like to monitor for now. Call if headaches are getting worse. Follow up in 3-6 months.

## 2020-06-28 NOTE — Progress Notes (Signed)
BP 122/76   Pulse 60   Temp (!) 97.5 F (36.4 C)   Wt 231 lb 6.4 oz (105 kg)   SpO2 100%   BMI 33.88 kg/m    Subjective:    Patient ID: Deborah Summers, female    DOB: Mar 06, 2001, 19 y.o.   MRN: [id number]  HPI: Deborah Summers is a 19 y.o. female  Chief Complaint  Patient presents with  . Migraine   MIGRAINES Duration: chronic Onset: sudden Severity: severe Quality: sharp and throbbing Frequency: about 3x a month Location: behind her eye Headache duration: 24 hours Radiation: no Time of day headache occurs: at random Headache status at time of visit: asymptomatic Treatments attempted: Treatments attempted: rest, ice, heat, APAP, ibuprofen, aleve", excedrine, triptans and topamax   Aura: no Nausea:  yes Vomiting: yes Photophobia:  yes Phonophobia:  yes Effect on social functioning:  yes Numbers of missed days of school/work each month:  Confusion:  no Gait disturbance/ataxia:  no Behavioral changes:  no Fevers:  no  Relevant past medical, surgical, family and social history reviewed and updated as indicated. Interim medical history since our last visit reviewed. Allergies and medications reviewed and updated.  Review of Systems  Constitutional: Negative.   Respiratory: Negative.   Cardiovascular: Negative.   Gastrointestinal: Negative.   Neurological: Positive for numbness and headaches. Negative for dizziness, tremors, seizures, syncope, facial asymmetry, speech difficulty, weakness and light-headedness.  Psychiatric/Behavioral: Negative.     Per HPI unless specifically indicated above     Objective:    BP 122/76   Pulse 60   Temp (!) 97.5 F (36.4 C)   Wt 231 lb 6.4 oz (105 kg)   SpO2 100%   BMI 33.88 kg/m   Wt Readings from Last 3 Encounters:  06/28/20 231 lb 6.4 oz (105 kg) (99 %, Z= 2.33)*  05/31/20 234 lb (106.1 kg) (>99 %, Z= 2.35)*  04/22/20 229 lb (103.9 kg) (99 %, Z= 2.30)*   * Growth percentiles are based on CDC (Girls, 2-20 Years)  data.    Physical Exam Vitals and nursing note reviewed.  Constitutional:      General: She is not in acute distress.    Appearance: Normal appearance. She is not ill-appearing, toxic-appearing or diaphoretic.  HENT:     Head: Normocephalic and atraumatic.     Right Ear: External ear normal.     Left Ear: External ear normal.     Nose: Nose normal.     Mouth/Throat:     Mouth: Mucous membranes are moist.     Pharynx: Oropharynx is clear.  Eyes:     General: No scleral icterus.       Right eye: No discharge.        Left eye: No discharge.     Extraocular Movements: Extraocular movements intact.     Conjunctiva/sclera: Conjunctivae normal.     Pupils: Pupils are equal, round, and reactive to light.  Cardiovascular:     Rate and Rhythm: Normal rate and regular rhythm.     Pulses: Normal pulses.     Heart sounds: Normal heart sounds. No murmur heard.  No friction rub. No gallop.   Pulmonary:     Effort: Pulmonary effort is normal. No respiratory distress.     Breath sounds: Normal breath sounds. No stridor. No wheezing, rhonchi or rales.  Chest:     Chest wall: No tenderness.  Musculoskeletal:        General: Normal range of motion.  Cervical back: Normal range of motion and neck supple.  Skin:    General: Skin is warm and dry.     Capillary Refill: Capillary refill takes less than 2 seconds.     Coloration: Skin is not jaundiced or pale.     Findings: No bruising, erythema, lesion or rash.  Neurological:     General: No focal deficit present.     Mental Status: She is alert and oriented to person, place, and time. Mental status is at baseline.  Psychiatric:        Mood and Affect: Mood normal.        Behavior: Behavior normal.        Thought Content: Thought content normal.        Judgment: Judgment normal.     Results for orders placed or performed in visit on 03/25/20  CBC with Differential/Platelet  Result Value Ref Range   WBC 5.6 3.4 - 10.8 x10E3/uL   RBC  4.72 3.77 - 5.28 x10E6/uL   Hemoglobin 14.3 11.1 - 15.9 g/dL   Hematocrit 28.4 13.2 - 46.6 %   MCV 90 79 - 97 fL   MCH 30.3 26.6 - 33.0 pg   MCHC 33.7 31 - 35 g/dL   RDW 44.0 10.2 - 72.5 %   Platelets 196 150 - 450 x10E3/uL   Neutrophils 61 Not Estab. %   Lymphs 31 Not Estab. %   Monocytes 6 Not Estab. %   Eos 1 Not Estab. %   Basos 1 Not Estab. %   Neutrophils Absolute 3.4 1.40 - 7.00 x10E3/uL   Lymphocytes Absolute 1.8 0 - 3 x10E3/uL   Monocytes Absolute 0.3 0 - 0 x10E3/uL   EOS (ABSOLUTE) 0.1 0.0 - 0.4 x10E3/uL   Basophils Absolute 0.0 0 - 0 x10E3/uL   Immature Granulocytes 0 Not Estab. %   Immature Grans (Abs) 0.0 0.0 - 0.1 x10E3/uL  Comprehensive metabolic panel  Result Value Ref Range   Glucose 82 65 - 99 mg/dL   BUN 7 6 - 20 mg/dL   Creatinine, Ser 3.66 0.57 - 1.00 mg/dL   GFR calc non Af Amer 114 >59 mL/min/1.73   GFR calc Af Amer 132 >59 mL/min/1.73   BUN/Creatinine Ratio 9 9 - 23   Sodium 139 134 - 144 mmol/L   Potassium 4.0 3.5 - 5.2 mmol/L   Chloride 107 (H) 96 - 106 mmol/L   CO2 21 20 - 29 mmol/L   Calcium 9.5 8.7 - 10.2 mg/dL   Total Protein 7.0 6.0 - 8.5 g/dL   Albumin 4.2 3.9 - 5.0 g/dL   Globulin, Total 2.8 1.5 - 4.5 g/dL   Albumin/Globulin Ratio 1.5 1.2 - 2.2   Bilirubin Total 0.4 0.0 - 1.2 mg/dL   Alkaline Phosphatase 72 45 - 106 IU/L   AST 10 0 - 40 IU/L   ALT 10 0 - 32 IU/L  Lipid Panel w/o Chol/HDL Ratio  Result Value Ref Range   Cholesterol, Total 186 (H) 100 - 169 mg/dL   Triglycerides 440 (H) 0 - 89 mg/dL   HDL 53 >34 mg/dL   VLDL Cholesterol Cal 40 5 - 40 mg/dL   LDL Chol Calc (NIH) 93 0 - 109 mg/dL  TSH  Result Value Ref Range   TSH 0.907 0.450 - 4.500 uIU/mL  Iron and TIBC  Result Value Ref Range   Total Iron Binding Capacity 396 250 - 450 ug/dL   UIBC 742 595 - 638 ug/dL   Iron  99 27 - 159 ug/dL   Iron Saturation 25 15 - 55 %  Ferritin  Result Value Ref Range   Ferritin 51 15.0 - 77.0 ng/mL  QuantiFERON-TB Gold Plus  Result  Value Ref Range   QuantiFERON Incubation Incubation performed.    QuantiFERON Criteria Comment    QuantiFERON TB1 Ag Value 0.02 IU/mL   QuantiFERON TB2 Ag Value 0.01 IU/mL   QuantiFERON Nil Value 0.00 IU/mL   QuantiFERON Mitogen Value >10.00 IU/mL   QuantiFERON-TB Gold Plus Negative Negative      Assessment & Plan:   Problem List Items Addressed This Visit      Cardiovascular and Mediastinum   Migraine without aura    Improved. Discussed increasing her aimovig to 140mg  daily vs monitoring. She would like to monitor for now. Call if headaches are getting worse. Follow up in 3-6 months.       Relevant Medications   Topiramate ER (TROKENDI XR) 100 MG CP24   SUMAtriptan (IMITREX) 100 MG tablet   Erenumab-aooe (AIMOVIG) 70 MG/ML SOAJ       Follow up plan: Return 3-6 months, for follow up migraine.

## 2020-09-05 ENCOUNTER — Encounter: Payer: Self-pay | Admitting: Family Medicine

## 2020-09-05 ENCOUNTER — Telehealth: Payer: Self-pay | Admitting: Family Medicine

## 2020-09-05 NOTE — Telephone Encounter (Signed)
Patient called to inform the office that the pharmacy would like to give the patient an alternative medication to Erenumab-aooe (AIMOVIG) 70 MG/ML SOAJ, because the insurance will not cover it.  Please call patient to give her an update because she has been without the medication for a few days.  CB# [phone]

## 2020-09-07 NOTE — Telephone Encounter (Signed)
PA submitted via cover my meds for Aimovig.

## 2020-09-13 NOTE — Telephone Encounter (Signed)
Patient's mom has called back in on patient's behalf requesting an update on a response from [employer] Patient is at work and unable to talk until later on in the day but patient's mom can be contacted if needed Patient's mom can be contacted [phone]

## 2020-09-13 NOTE — Telephone Encounter (Signed)
Called and spoke with insurance, PA was closed, started a new PA for the medication with an a rush to see if we can get the medication approved.   Reference # [id number]  Patient's mother notified.

## 2020-09-14 NOTE — Telephone Encounter (Signed)
Please advise 

## 2020-09-14 NOTE — Telephone Encounter (Signed)
Mother has called back in and said she tried this a.m. and it was still denied, says she does not think they are going to pay, states she had spoke with you re AJOVY, is that a possibility?  CB [phone]

## 2020-09-14 NOTE — Telephone Encounter (Signed)
Can medication be switched?

## 2020-09-16 NOTE — Telephone Encounter (Signed)
The patients mother called this morning and would like for it to be switched Ajovy. She knows that's covered on her insurance. She can be reached at [phone]. Please advise

## 2020-09-18 MED ORDER — AJOVY 225 MG/1.5ML ~~LOC~~ SOAJ: 225.0000 mg | SUBCUTANEOUS | 6 refills | Status: DC

## 2020-09-18 NOTE — Addendum Note (Signed)
Addended by: Dorcas Carrow on: 09/18/2020 04:12 PM   Modules accepted: Orders

## 2020-09-28 ENCOUNTER — Ambulatory Visit: Payer: BC Managed Care – PPO | Admitting: Family Medicine

## 2020-09-29 ENCOUNTER — Ambulatory Visit: Payer: BC Managed Care – PPO | Admitting: Family Medicine

## 2020-09-29 ENCOUNTER — Encounter: Payer: Self-pay | Admitting: Family Medicine

## 2020-09-29 ENCOUNTER — Telehealth: Payer: Self-pay

## 2020-09-29 ENCOUNTER — Other Ambulatory Visit: Payer: Self-pay | Admitting: Family Medicine

## 2020-09-29 ENCOUNTER — Other Ambulatory Visit: Payer: Self-pay

## 2020-09-29 VITALS — BP 107/71 | HR 61 | Temp 97.9°F | Wt 225.0 lb

## 2020-09-29 DIAGNOSIS — G43009 Migraine without aura, not intractable, without status migrainosus: Secondary | ICD-10-CM | POA: Diagnosis not present

## 2020-09-29 MED ORDER — AIMOVIG 140 MG/ML ~~LOC~~ SOAJ: 1.0000 mL | SUBCUTANEOUS | 6 refills | Status: DC

## 2020-09-29 MED ORDER — KETOROLAC TROMETHAMINE 60 MG/2ML IM SOLN
60.0000 mg | Freq: Once | INTRAMUSCULAR | Status: AC
  Administered 2020-09-29: 60 mg via INTRAMUSCULAR

## 2020-09-29 NOTE — Progress Notes (Signed)
BP 107/71   Pulse 61   Temp 97.9 F (36.6 C)   Wt 225 lb (102.1 kg)   SpO2 99%   BMI 32.94 kg/m    Subjective:    Patient ID: Deborah Summers, female    DOB: January 18, 2001, 20 y.o.   MRN: [id number]  HPI: Deborah Summers is a 20 y.o. female  Chief Complaint  Patient presents with  . Migraine   MIGRAINES- started on the ajovy about 2 weeks ago, has noticed an increase in her headaches for about a month. She does not feel like it is working as well Duration: chronic Onset: sudden Severity: severe Quality: sharp and throbbing Frequency: 3-4x a month, back up again Location: behind her eye Headache duration: 24 hours Radiation: no Time of day headache occurs: at random Headache status at time of visit: current headache Treatments attempted: Treatments attempted: none, rest, ice, heat, APAP, ibuprofen, aleve", excedrine, triptans and topamax   Aura: no Nausea:  yes Vomiting: yes Photophobia:  yes Phonophobia:  yes Effect on social functioning:  yes Confusion:  no Gait disturbance/ataxia:  no Behavioral changes:  no Fevers:  no  Relevant past medical, surgical, family and social history reviewed and updated as indicated. Interim medical history since our last visit reviewed. Allergies and medications reviewed and updated.  Review of Systems  Constitutional: Negative.   Respiratory: Negative.   Cardiovascular: Negative.   Gastrointestinal: Negative.   Neurological: Positive for headaches. Negative for dizziness, tremors, seizures, syncope, facial asymmetry, speech difficulty, weakness, light-headedness and numbness.  Psychiatric/Behavioral: Negative.     Per HPI unless specifically indicated above     Objective:    BP 107/71   Pulse 61   Temp 97.9 F (36.6 C)   Wt 225 lb (102.1 kg)   SpO2 99%   BMI 32.94 kg/m   Wt Readings from Last 3 Encounters:  09/29/20 225 lb (102.1 kg) (99 %, Z= 2.26)*  06/28/20 231 lb 6.4 oz (105 kg) (99 %, Z= 2.33)*  05/31/20 234 lb  (106.1 kg) (>99 %, Z= 2.35)*   * Growth percentiles are based on CDC (Girls, 2-20 Years) data.    Physical Exam Vitals and nursing note reviewed.  Constitutional:      General: She is not in acute distress.    Appearance: Normal appearance. She is not ill-appearing, toxic-appearing or diaphoretic.  HENT:     Head: Normocephalic and atraumatic.     Right Ear: External ear normal.     Left Ear: External ear normal.     Nose: Nose normal.     Mouth/Throat:     Mouth: Mucous membranes are moist.     Pharynx: Oropharynx is clear.  Eyes:     General: No scleral icterus.       Right eye: No discharge.        Left eye: No discharge.     Extraocular Movements: Extraocular movements intact.     Conjunctiva/sclera: Conjunctivae normal.     Pupils: Pupils are equal, round, and reactive to light.  Cardiovascular:     Rate and Rhythm: Normal rate and regular rhythm.     Pulses: Normal pulses.     Heart sounds: Normal heart sounds. No murmur heard. No friction rub. No gallop.   Pulmonary:     Effort: Pulmonary effort is normal. No respiratory distress.     Breath sounds: Normal breath sounds. No stridor. No wheezing, rhonchi or rales.  Chest:     Chest wall:  No tenderness.  Musculoskeletal:        General: Normal range of motion.     Cervical back: Normal range of motion and neck supple.  Skin:    General: Skin is warm and dry.     Capillary Refill: Capillary refill takes less than 2 seconds.     Coloration: Skin is not jaundiced or pale.     Findings: No bruising, erythema, lesion or rash.  Neurological:     General: No focal deficit present.     Mental Status: She is alert and oriented to person, place, and time. Mental status is at baseline.  Psychiatric:        Mood and Affect: Mood normal.        Behavior: Behavior normal.        Thought Content: Thought content normal.        Judgment: Judgment normal.     Results for orders placed or performed in visit on 03/25/20  CBC  with Differential/Platelet  Result Value Ref Range   WBC 5.6 3.4 - 10.8 x10E3/uL   RBC 4.72 3.77 - 5.28 x10E6/uL   Hemoglobin 14.3 11.1 - 15.9 g/dL   Hematocrit 83.6 62.9 - 46.6 %   MCV 90 79 - 97 fL   MCH 30.3 26.6 - 33.0 pg   MCHC 33.7 31.5 - 35.7 g/dL   RDW 47.6 54.6 - 50.3 %   Platelets 196 150 - 450 x10E3/uL   Neutrophils 61 Not Estab. %   Lymphs 31 Not Estab. %   Monocytes 6 Not Estab. %   Eos 1 Not Estab. %   Basos 1 Not Estab. %   Neutrophils Absolute 3.4 1.4 - 7.0 x10E3/uL   Lymphocytes Absolute 1.8 0.7 - 3.1 x10E3/uL   Monocytes Absolute 0.3 0.1 - 0.9 x10E3/uL   EOS (ABSOLUTE) 0.1 0.0 - 0.4 x10E3/uL   Basophils Absolute 0.0 0.0 - 0.2 x10E3/uL   Immature Granulocytes 0 Not Estab. %   Immature Grans (Abs) 0.0 0.0 - 0.1 x10E3/uL  Comprehensive metabolic panel  Result Value Ref Range   Glucose 82 65 - 99 mg/dL   BUN 7 6 - 20 mg/dL   Creatinine, Ser 5.46 0.57 - 1.00 mg/dL   GFR calc non Af Amer 114 >59 mL/min/1.73   GFR calc Af Amer 132 >59 mL/min/1.73   BUN/Creatinine Ratio 9 9 - 23   Sodium 139 134 - 144 mmol/L   Potassium 4.0 3.5 - 5.2 mmol/L   Chloride 107 (H) 96 - 106 mmol/L   CO2 21 20 - 29 mmol/L   Calcium 9.5 8.7 - 10.2 mg/dL   Total Protein 7.0 6.0 - 8.5 g/dL   Albumin 4.2 3.9 - 5.0 g/dL   Globulin, Total 2.8 1.5 - 4.5 g/dL   Albumin/Globulin Ratio 1.5 1.2 - 2.2   Bilirubin Total 0.4 0.0 - 1.2 mg/dL   Alkaline Phosphatase 72 45 - 106 IU/L   AST 10 0 - 40 IU/L   ALT 10 0 - 32 IU/L  Lipid Panel w/o Chol/HDL Ratio  Result Value Ref Range   Cholesterol, Total 186 (H) 100 - 169 mg/dL   Triglycerides 568 (H) 0 - 89 mg/dL   HDL 53 >12 mg/dL   VLDL Cholesterol Cal 40 5 - 40 mg/dL   LDL Chol Calc (NIH) 93 0 - 109 mg/dL  TSH  Result Value Ref Range   TSH 0.907 0.450 - 4.500 uIU/mL  Iron and TIBC  Result Value Ref Range  Total Iron Binding Capacity 396 250 - 450 ug/dL   UIBC 889 169 - 450 ug/dL   Iron 99 27 - 388 ug/dL   Iron Saturation 25 15 - 55 %   Ferritin  Result Value Ref Range   Ferritin 51 15 - 77 ng/mL  QuantiFERON-TB Gold Plus  Result Value Ref Range   QuantiFERON Incubation Incubation performed.    QuantiFERON Criteria Comment    QuantiFERON TB1 Ag Value 0.02 IU/mL   QuantiFERON TB2 Ag Value 0.01 IU/mL   QuantiFERON Nil Value 0.00 IU/mL   QuantiFERON Mitogen Value >10.00 IU/mL   QuantiFERON-TB Gold Plus Negative Negative      Assessment & Plan:   Problem List Items Addressed This Visit      Cardiovascular and Mediastinum   Migraine without aura - Primary    In acute exacerbation today. Will treat with toradol. Not doing well on her ajovy- will see if we can get her back on her aimovig and increase her to 140mg . Recheck 3 months. Call with any concerns.       Relevant Medications   Erenumab-aooe (AIMOVIG) 140 MG/ML SOAJ       Follow up plan: Return in about 3 months (around 12/27/2020).

## 2020-09-29 NOTE — Assessment & Plan Note (Signed)
In acute exacerbation today. Will treat with toradol. Not doing well on her ajovy- will see if we can get her back on her aimovig and increase her to 140mg . Recheck 3 months. Call with any concerns.

## 2020-09-29 NOTE — Telephone Encounter (Signed)
PA for Aimovig initiated and submitted via Cover My Meds. Key: [id number]

## 2020-09-29 NOTE — Telephone Encounter (Signed)
-----   Message from Dorcas Carrow, [place] sent at 09/29/2020  9:48 AM EST ----- Failing Deborah Summers- asking pharmacy to send PA to see if we can get her her aimovig back.

## 2020-09-29 NOTE — Telephone Encounter (Signed)
   Notes to clinic:   Alternative Requested:NEEDS TO BE APPROVED, NOT ON FORMULARY  Requested Prescriptions  Pending Prescriptions Disp Refills   AJOVY 225 MG/1.5ML SOSY [Pharmacy Med Name: AJOVY 225 MG/1.5 ML SYRINGE]  0      Off-Protocol Failed - 09/29/2020 10:08 AM      Failed - Medication not assigned to a protocol, review manually.      Passed - Valid encounter within last 12 months    Recent Outpatient Visits           Today Migraine without aura and without status migrainosus, not intractable   [facility] [place], Megan P, DO   3 months ago Migraine without aura and without status migrainosus, not intractable   [facility] [place], Megan P, DO   4 months ago Migraine without aura and without status migrainosus, not intractable   [facility], Megan P, DO   5 months ago Migraine without aura and without status migrainosus, not intractable   [facility], Megan P, DO   6 months ago Migraine without aura and without status migrainosus, not intractable   [facility] [place], [place], DO       Future Appointments             In 3 months Johnson, Oralia Rud, DO [facility]

## 2020-09-30 NOTE — Telephone Encounter (Signed)
PA approved. Patient notified via Mychart message. ?

## 2020-10-14 ENCOUNTER — Encounter: Payer: Self-pay | Admitting: Family Medicine

## 2020-10-14 MED ORDER — AJOVY 225 MG/1.5ML ~~LOC~~ SOAJ: 225.0000 mg | SUBCUTANEOUS | 6 refills | Status: DC

## 2020-11-21 ENCOUNTER — Ambulatory Visit (INDEPENDENT_AMBULATORY_CARE_PROVIDER_SITE_OTHER): Payer: BC Managed Care – PPO

## 2020-11-21 ENCOUNTER — Ambulatory Visit: Payer: Self-pay | Admitting: *Deleted

## 2020-11-21 ENCOUNTER — Other Ambulatory Visit: Payer: Self-pay

## 2020-11-21 DIAGNOSIS — G43009 Migraine without aura, not intractable, without status migrainosus: Secondary | ICD-10-CM

## 2020-11-21 MED ORDER — PROMETHAZINE HCL 25 MG/ML IJ SOLN
25.0000 mg | Freq: Once | INTRAMUSCULAR | Status: AC
  Administered 2020-11-21: 25 mg via INTRAMUSCULAR

## 2020-11-21 MED ORDER — PROMETHAZINE HCL 50 MG/ML IJ SOLN: 25.0000 mg | Freq: Once | INTRAMUSCULAR | Status: DC

## 2020-11-21 MED ORDER — KETOROLAC TROMETHAMINE 60 MG/2ML IM SOLN
60.0000 mg | Freq: Once | INTRAMUSCULAR | Status: AC
  Administered 2020-11-21: 60 mg via INTRAMUSCULAR

## 2020-11-21 NOTE — Addendum Note (Signed)
Addended by: Dorcas Carrow on: 11/21/2020 01:17 PM   Modules accepted: Orders

## 2020-11-21 NOTE — Telephone Encounter (Signed)
If she can get a ride we can get her a toradol and phenergan shot. Orders in

## 2020-11-21 NOTE — Telephone Encounter (Signed)
On schedule for today.

## 2020-11-21 NOTE — Telephone Encounter (Addendum)
I returned pt's call.   She is having a migraine with pain in her left temple and going into the back of her head.  Having nausea and light sensitivity.   She has taken 2 Imitrex tablets without relief.   She took the Ajovy injection on the 11/17/2020.  Seeking advice on what to do now for the headache.  No appts available with Dr. Laural Benes today.  I sent a high priority note to [facility] for Dr. Olevia Perches for her review.  Pt was agreeable to this plan and can be reached at [phone].  Reason for Disposition . [1] SEVERE headache (e.g., excruciating) AND [2] not improved after 2 hours of pain medicine    Migraine has not responded to her usual medications.  Answer Assessment - Initial Assessment Questions 1. LOCATION: "Where does it hurt?"      Migraine in left temple and into the back of my head.   Having nausea.   Sensitive to light.   Noise is fine. 2. ONSET: "When did the headache start?" (Minutes, hours or days)      Last night.   When I woke up this morning it was still there.  I took Elavil 3. PATTERN: "Does the pain come and go, or has it been constant since it started?"     Constant  4. SEVERITY: "How bad is the pain?" and "What does it keep you from doing?"  (e.g., Scale 1-10; mild, moderate, or severe)   - MILD (1-3): doesn't interfere with normal activities    - MODERATE (4-7): interferes with normal activities or awakens from sleep    - SEVERE (8-10): excruciating pain, unable to do any normal activities        8 on scale 5. RECURRENT SYMPTOM: "Have you ever had headaches before?" If Yes, ask: "When was the last time?" and "What happened that time?"      Yes  I did a shot on the 14th.  Ajovy.     6. CAUSE: "What do you think is causing the headache?"     Maybe weather 7. MIGRAINE: "Have you been diagnosed with migraine headaches?" If Yes, ask: "Is this headache similar?"      Yes and yes  8. HEAD INJURY: "Has there been any recent injury to the head?"       No  9. OTHER SYMPTOMS: "Do you have any other symptoms?" (fever, stiff neck, eye pain, sore throat, cold symptoms)     Nausea, light sensitivity.   Noise not bother me. 10. PREGNANCY: "Is there any chance you are pregnant?" "When was your last menstrual period?"       Not asked  Protocols used: HEADACHE-A-AH

## 2020-12-29 ENCOUNTER — Encounter: Payer: Self-pay | Admitting: Family Medicine

## 2020-12-29 ENCOUNTER — Ambulatory Visit: Payer: BC Managed Care – PPO | Admitting: Family Medicine

## 2020-12-29 ENCOUNTER — Other Ambulatory Visit: Payer: Self-pay

## 2020-12-29 VITALS — BP 101/68 | HR 69 | Temp 98.4°F | Ht 69.0 in | Wt 232.8 lb

## 2020-12-29 DIAGNOSIS — Z020 Encounter for examination for admission to educational institution: Secondary | ICD-10-CM

## 2020-12-29 DIAGNOSIS — G43009 Migraine without aura, not intractable, without status migrainosus: Secondary | ICD-10-CM | POA: Diagnosis not present

## 2020-12-29 DIAGNOSIS — Z111 Encounter for screening for respiratory tuberculosis: Secondary | ICD-10-CM

## 2020-12-29 LAB — URINALYSIS, ROUTINE W REFLEX MICROSCOPIC
Bilirubin, UA: NEGATIVE
Glucose, UA: NEGATIVE
Leukocytes,UA: NEGATIVE
Nitrite, UA: NEGATIVE
Protein,UA: NEGATIVE
RBC, UA: NEGATIVE
Specific Gravity, UA: 1.02 (ref 1.005–1.030)
Urobilinogen, Ur: 0.2 mg/dL (ref 0.2–1.0)
pH, UA: 6 (ref 5.0–7.5)

## 2020-12-29 MED ORDER — TROKENDI XR 100 MG PO CP24: 100.0000 mg | ORAL_CAPSULE | Freq: Every day | ORAL | 1 refills | Status: DC

## 2020-12-29 MED ORDER — SPRINTEC 28 0.25-35 MG-MCG PO TABS: 1.0000 | ORAL_TABLET | Freq: Every day | ORAL | 11 refills | Status: DC

## 2020-12-29 MED ORDER — AJOVY 225 MG/1.5ML ~~LOC~~ SOAJ: 225.0000 mg | SUBCUTANEOUS | 6 refills | Status: DC

## 2020-12-29 MED ORDER — SUMATRIPTAN SUCCINATE 100 MG PO TABS: 100.0000 mg | ORAL_TABLET | Freq: Every day | ORAL | 12 refills | Status: DC

## 2020-12-29 NOTE — Assessment & Plan Note (Signed)
Still not under great control on topamax and ajovy. BP too low for propranolol. Will refer to neurology to evaluate for ?botox. Refills given today.

## 2020-12-29 NOTE — Progress Notes (Signed)
BP 101/68   Pulse 69   Temp 98.4 F (36.9 C)   Ht 5\' 9"  (1.753 Deborah)   Wt 232 lb 12.8 oz (105.6 kg)   SpO2 99%   BMI 34.38 kg/Deborah    Subjective:    Patient ID: Deborah Summers, female    DOB: [birth year], 20 y.o.   MRN: [id number]  HPI: [id number] Deborah Summers is a 20 y.o. female  Chief Complaint  Patient presents with  . Migraine    Follow up   . 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 She is feeling well. No concerns.   She is having migraines about 2x a month. They are not as bad. Feeling better in general with them, but they are becoming harder to go away. 12 is working OK and she feels like her migraines are tolerable, but still not good.   Relevant past medical, surgical, family and social history reviewed and updated as indicated. Interim medical history since our last visit reviewed. Allergies and medications reviewed and updated.  Review of Systems  Constitutional: Negative.   Respiratory: Negative.   Cardiovascular: Negative.   Gastrointestinal: Negative.   Musculoskeletal: Negative.   Neurological: Negative.   Psychiatric/Behavioral: Negative.     Per HPI unless specifically indicated above     Objective:    BP 101/68   Pulse 69   Temp 98.4 F (36.9 C)   Ht 5\' 9"  (1.753 Deborah)   Wt 232 lb 12.8 oz (105.6 kg)   SpO2 99%   BMI 34.38 kg/Deborah   Wt Readings from Last 3 Encounters:  12/29/20 232 lb 12.8 oz (105.6 kg) (>99 %, Z= 2.35)*  09/29/20 225 lb (102.1 kg) (99 %, Z= 2.26)*  06/28/20 231 lb 6.4 oz (105 kg) (99 %, Z= 2.33)*   * Growth percentiles are based on CDC (Girls, 2-20 Years) data.     Hearing Screening   125Hz  250Hz  500Hz  1000Hz  2000Hz  3000Hz  4000Hz  6000Hz  8000Hz   Right ear:   Pass Pass Pass  Pass    Left ear:   Pass Pass Pass  Pass      Visual Acuity Screening   Right eye Left eye Both eyes  Without correction:     With correction: 20/20 20/20 20/20     Physical Exam Vitals and  nursing note reviewed.  Constitutional:      General: She is not in acute distress.    Appearance: Normal appearance. She is not ill-appearing, toxic-appearing or diaphoretic.  HENT:     Head: Normocephalic and atraumatic.     Right Ear: External ear normal.     Left Ear: External ear normal.     Nose: Nose normal.     Mouth/Throat:     Mouth: Mucous membranes are moist.     Pharynx: Oropharynx is clear.  Eyes:     General: No scleral icterus.       Right eye: No discharge.        Left eye: No discharge.     Extraocular Movements: Extraocular movements intact.     Conjunctiva/sclera: Conjunctivae normal.     Pupils: Pupils are equal, round, and reactive to light.  Cardiovascular:     Rate and Rhythm: Normal rate and regular rhythm.     Pulses: Normal pulses.     Heart sounds: Normal heart sounds. No murmur heard. No friction rub. No gallop.   Pulmonary:  Effort: Pulmonary effort is normal. No respiratory distress.     Breath sounds: Normal breath sounds. No stridor. No wheezing, rhonchi or rales.  Chest:     Chest wall: No tenderness.  Musculoskeletal:        General: Normal range of motion.     Cervical back: Normal range of motion and neck supple.  Skin:    General: Skin is warm and dry.     Capillary Refill: Capillary refill takes less than 2 seconds.     Coloration: Skin is not jaundiced or pale.     Findings: No bruising, erythema, lesion or rash.  Neurological:     General: No focal deficit present.     Mental Status: She is alert and oriented to person, place, and time. Mental status is at baseline.  Psychiatric:        Mood and Affect: Mood normal.        Behavior: Behavior normal.        Thought Content: Thought content normal.        Judgment: Judgment normal.     Results for orders placed or performed in visit on 03/25/20  CBC with Differential/Platelet  Result Value Ref Range   WBC 5.6 3.4 - 10.8 x10E3/uL   RBC 4.72 3.77 - 5.28 x10E6/uL   Hemoglobin  14.3 11.1 - 15.9 g/dL   Hematocrit 03.5 46.5 - 46.6 %   MCV 90 79 - 97 fL   MCH 30.3 26.6 - 33.0 pg   MCHC 33.7 31.5 - 35.7 g/dL   RDW 68.1 27.5 - 17.0 %   Platelets 196 150 - 450 x10E3/uL   Neutrophils 61 Not Estab. %   Lymphs 31 Not Estab. %   Monocytes 6 Not Estab. %   Eos 1 Not Estab. %   Basos 1 Not Estab. %   Neutrophils Absolute 3.4 1.4 - 7.0 x10E3/uL   Lymphocytes Absolute 1.8 0.7 - 3.1 x10E3/uL   Monocytes Absolute 0.3 0.1 - 0.9 x10E3/uL   EOS (ABSOLUTE) 0.1 0.0 - 0.4 x10E3/uL   Basophils Absolute 0.0 0.0 - 0.2 x10E3/uL   Immature Granulocytes 0 Not Estab. %   Immature Grans (Abs) 0.0 0.0 - 0.1 x10E3/uL  Comprehensive metabolic panel  Result Value Ref Range   Glucose 82 65 - 99 mg/dL   BUN 7 6 - 20 mg/dL   Creatinine, Ser 0.17 0.57 - 1.00 mg/dL   GFR calc non Af Amer 114 >59 mL/min/1.73   GFR calc Af Amer 132 >59 mL/min/1.73   BUN/Creatinine Ratio 9 9 - 23   Sodium 139 134 - 144 mmol/L   Potassium 4.0 3.5 - 5.2 mmol/L   Chloride 107 (H) 96 - 106 mmol/L   CO2 21 20 - 29 mmol/L   Calcium 9.5 8.7 - 10.2 mg/dL   Total Protein 7.0 6.0 - 8.5 g/dL   Albumin 4.2 3.9 - 5.0 g/dL   Globulin, Total 2.8 1.5 - 4.5 g/dL   Albumin/Globulin Ratio 1.5 1.2 - 2.2   Bilirubin Total 0.4 0.0 - 1.2 mg/dL   Alkaline Phosphatase 72 45 - 106 IU/L   AST 10 0 - 40 IU/L   ALT 10 0 - 32 IU/L  Lipid Panel w/o Chol/HDL Ratio  Result Value Ref Range   Cholesterol, Total 186 (H) 100 - 169 mg/dL   Triglycerides 494 (H) 0 - 89 mg/dL   HDL 53 >49 mg/dL   VLDL Cholesterol Cal 40 5 - 40 mg/dL   LDL Chol Calc (  NIH) 93 0 - 109 mg/dL  TSH  Result Value Ref Range   TSH 0.907 0.450 - 4.500 uIU/mL  Iron and TIBC  Result Value Ref Range   Total Iron Binding Capacity 396 250 - 450 ug/dL   UIBC 366 294 - 765 ug/dL   Iron 99 27 - 465 ug/dL   Iron Saturation 25 15 - 55 %  Ferritin  Result Value Ref Range   Ferritin 51 15 - 77 ng/mL  QuantiFERON-TB Gold Plus  Result Value Ref Range   QuantiFERON  Incubation Incubation performed.    QuantiFERON Criteria Comment    QuantiFERON TB1 Ag Value 0.02 IU/mL   QuantiFERON TB2 Ag Value 0.01 IU/mL   QuantiFERON Nil Value 0.00 IU/mL   QuantiFERON Mitogen Value >10.00 IU/mL   QuantiFERON-TB Gold Plus Negative Negative      Assessment & Plan:   Problem List Items Addressed This Visit      Cardiovascular and Mediastinum   Migraine without aura - Primary    Still not under great control on topamax and ajovy. BP too low for propranolol. Will refer to neurology to evaluate for ?botox. Refills given today.      Relevant Medications   Fremanezumab-vfrm (AJOVY) 225 MG/1.5ML SOAJ   SUMAtriptan (IMITREX) 100 MG tablet   Topiramate ER (TROKENDI XR) 100 MG CP24   Other Relevant Orders   Ambulatory referral to Neurology    Other Visit Diagnoses    School physical exam       Foms filled out. Doing well. Call with any concerns.    Relevant Orders   Urinalysis, Routine w reflex microscopic   CBC with Differential/Platelet   QuantiFERON-TB Gold Plus   Screening for tuberculosis       Quantiferon drawn today. Await results.    Relevant Orders   QuantiFERON-TB Gold Plus       Follow up plan: Return in about 6 months (around 07/01/2021), or physical.

## 2020-12-30 LAB — CBC WITH DIFFERENTIAL/PLATELET
Hemoglobin: 14.6 g/dL (ref 11.1–15.9)
Immature Granulocytes: 0 %
MCV: 89 fL (ref 79–97)

## 2020-12-31 LAB — CBC WITH DIFFERENTIAL/PLATELET
Basophils Absolute: 0 10*3/uL (ref 0.0–0.2)
RDW: 12.3 % (ref 11.7–15.4)

## 2021-01-01 ENCOUNTER — Encounter: Payer: Self-pay | Admitting: Family Medicine

## 2021-01-01 LAB — CBC WITH DIFFERENTIAL/PLATELET
Basos: 1 %
EOS (ABSOLUTE): 0.1 10*3/uL (ref 0.0–0.4)
Eos: 2 %
Hematocrit: 42.6 % (ref 34.0–46.6)
Immature Grans (Abs): 0 10*3/uL (ref 0.0–0.1)
Lymphocytes Absolute: 2 10*3/uL (ref 0.7–3.1)
Lymphs: 36 %
MCH: 30.6 pg (ref 26.6–33.0)
MCHC: 34.3 g/dL (ref 31.5–35.7)
Monocytes Absolute: 0.5 10*3/uL (ref 0.1–0.9)
Monocytes: 8 %
Neutrophils Absolute: 3.1 10*3/uL (ref 1.4–7.0)
Neutrophils: 53 %
Platelets: 197 10*3/uL (ref 150–450)
RBC: 4.77 x10E6/uL (ref 3.77–5.28)
WBC: 5.7 10*3/uL (ref 3.4–10.8)

## 2021-01-01 LAB — QUANTIFERON-TB GOLD PLUS
QuantiFERON Mitogen Value: >10 IU/mL
QuantiFERON Nil Value: 0 IU/mL
QuantiFERON TB1 Ag Value: 3.33 IU/mL
QuantiFERON TB2 Ag Value: 0.01 IU/mL
QuantiFERON-TB Gold Plus: POSITIVE — AB

## 2021-01-03 ENCOUNTER — Other Ambulatory Visit: Payer: Self-pay

## 2021-01-03 ENCOUNTER — Ambulatory Visit (LOCAL_COMMUNITY_HEALTH_CENTER): Payer: Self-pay

## 2021-01-03 VITALS — Ht 69.0 in | Wt 230.0 lb

## 2021-01-03 DIAGNOSIS — R7612 Nonspecific reaction to cell mediated immunity measurement of gamma interferon antigen response without active tuberculosis: Secondary | ICD-10-CM

## 2021-01-03 NOTE — Progress Notes (Signed)
EPI completed via phone. +QFT from patient's PCP on 12/29/20. Patient needs everything worked out by next week because she needs documents for nursing school.  Patient had negative QFT 9 months ago with no travel or known TB  exposure since then.  Informed patient that typically TB RN would offer to place a PPD but since patient is on time crunch [facility] will proceed as usual.  CXR ordered by [facility]. TB RN will f/u with patient once CXR results are complete and set up a time for patient to meet with TB RN to complete documents.   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   Patient declines HIV testing.Richmond Campbell, RN

## 2021-01-03 NOTE — Telephone Encounter (Signed)
Copied from CRM [id number]. 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 FU at [phone]

## 2021-01-03 NOTE — Telephone Encounter (Signed)
Patient notified of Dr.Johnosn's response

## 2021-01-04 ENCOUNTER — Other Ambulatory Visit: Payer: Self-pay

## 2021-01-04 ENCOUNTER — Ambulatory Visit
Admission: RE | Admit: 2021-01-04 | Discharge: 2021-01-04 | Disposition: A | Discharge status: Home / Self Care | Payer: BC Managed Care – PPO | Attending: Family Medicine | Admitting: Family Medicine

## 2021-01-04 ENCOUNTER — Ambulatory Visit
Admission: RE | Admit: 2021-01-04 | Discharge: 2021-01-04 | Disposition: A | Discharge status: Home / Self Care | Payer: BC Managed Care – PPO | Source: Ambulatory Visit | Attending: Family Medicine | Admitting: Family Medicine

## 2021-01-04 DIAGNOSIS — R7612 Nonspecific reaction to cell mediated immunity measurement of gamma interferon antigen response without active tuberculosis: Secondary | ICD-10-CM | POA: Diagnosis present

## 2021-01-09 NOTE — Progress Notes (Signed)
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  TB info given and offered to do PPD d/t questionable +QFT; patient declined. See scanned form. Richmond Campbell, RN

## 2021-07-03 ENCOUNTER — Encounter: Payer: BC Managed Care – PPO | Admitting: Family Medicine

## 2021-07-21 ENCOUNTER — Encounter: Payer: Self-pay | Admitting: Family Medicine

## 2021-07-21 ENCOUNTER — Other Ambulatory Visit: Payer: Self-pay

## 2021-07-21 ENCOUNTER — Ambulatory Visit (INDEPENDENT_AMBULATORY_CARE_PROVIDER_SITE_OTHER): Payer: BC Managed Care – PPO | Admitting: Family Medicine

## 2021-07-21 VITALS — BP 101/68 | HR 75 | Temp 98.2°F | Ht 69.0 in | Wt 231.8 lb

## 2021-07-21 DIAGNOSIS — Z23 Encounter for immunization: Secondary | ICD-10-CM | POA: Diagnosis not present

## 2021-07-21 DIAGNOSIS — Z0000 Encounter for general adult medical examination without abnormal findings: Secondary | ICD-10-CM | POA: Diagnosis not present

## 2021-07-21 DIAGNOSIS — R2232 Localized swelling, mass and lump, left upper limb: Secondary | ICD-10-CM

## 2021-07-21 DIAGNOSIS — G43009 Migraine without aura, not intractable, without status migrainosus: Secondary | ICD-10-CM | POA: Diagnosis not present

## 2021-07-21 MED ORDER — TROKENDI XR 100 MG PO CP24: 100.0000 mg | ORAL_CAPSULE | Freq: Every day | ORAL | 1 refills | Status: DC

## 2021-07-21 MED ORDER — AJOVY 225 MG/1.5ML ~~LOC~~ SOAJ
225.0000 mg | SUBCUTANEOUS | 6 refills | Status: DC
Start: 2021-07-21 — End: 2022-01-19

## 2021-07-21 NOTE — Assessment & Plan Note (Signed)
Under good control on current regimen. Continue current regimen. Continue to monitor. Call with any concerns. Refills given.   

## 2021-07-21 NOTE — Progress Notes (Signed)
BP 101/68    Pulse 75    Temp 98.2 F (36.8 C)    Ht 5\' 9"  (1.753 Deborah)    Wt 231 lb 12.8 oz (105.1 kg)    SpO2 98%    BMI 34.23 kg/Deborah    Subjective:    Patient ID: Deborah Summers, female    DOB: [birth year], 20 y.o.   MRN: [id number]  HPI: [id number] Deborah Summers is a 20 y.o. female presenting on 07/21/2021 for comprehensive medical examination. Current medical complaints include:  Migraines have been doing well. Tolerating her medicine well.   Has had some tenderness in her L breast.   Menopausal Symptoms: no  Depression Screen done today and results listed below:  Depression screen [facility] 2/9 07/21/2021 12/29/2020 05/31/2020 03/25/2020  Decreased Interest 0 0 0 0  Down, Depressed, Hopeless 0 0 0 0  PHQ - 2 Score 0 0 0 0  Altered sleeping 0 - 0 -  Tired, decreased energy 0 - 0 -  Change in appetite 0 - 0 -  Feeling bad or failure about yourself  0 - 0 -  Trouble concentrating 0 - 0 -  Moving slowly or fidgety/restless 0 - 0 -  Suicidal thoughts 0 - 0 -  PHQ-9 Score 0 - 0 -  Difficult doing work/chores - - Not difficult at all -    Past Medical History:  Past Medical History:  Diagnosis Date   Cold    getting over a cold/cough   Headache    Migraine    Patient denies medical problems     Surgical History:  Past Surgical History:  Procedure Laterality Date   TONSILLECTOMY AND ADENOIDECTOMY Bilateral 10/28/2015   Procedure: TONSILLECTOMY AND ADENOIDECTOMY;  Surgeon: 10/30/2015, MD;  Location: [facility];  Service: ENT;  Laterality: Bilateral;    Medications:  Current Outpatient Medications on File Prior to Visit  Medication Sig   butalbital-acetaminophen-caffeine (FIORICET) 50-325-40 MG tablet Take 1 tablet by mouth every 4 (four) hours as needed.   promethazine (PHENERGAN) 25 MG tablet Take 25 mg by mouth every 6 (six) hours as needed for nausea or vomiting.   SPRINTEC 28 0.25-35 MG-MCG tablet Take 1 tablet by mouth daily.   venlafaxine (EFFEXOR) 37.5 MG tablet  Take 37.5 mg in the morning and 75mg  at night.   No current facility-administered medications on file prior to visit.    Allergies:  Allergies  Allergen Reactions   Tape Rash    Social History:  Social History   Socioeconomic History   Marital status: Single    Spouse name: Not on file   Number of children: 0   Years of education: Not on file   Highest education level: Not on file  Occupational History   Not on file  Tobacco Use   Smoking status: Never   Smokeless tobacco: Never  Vaping Use   Vaping Use: Never used  Substance and Sexual Activity   Alcohol use: No   Drug use: No   Sexual activity: Not Currently  Other Topics Concern   Not on file  Social History Narrative   Not on file   Social Determinants of Health   Financial Resource Strain: Not on file  Food Insecurity: Not on file  Transportation Needs: Not on file  Physical Activity: Not on file  Stress: Not on file  Social Connections: Not on file  Intimate Partner Violence: Not on file   Social History   Tobacco Use  Smoking  Status Never  Smokeless Tobacco Never   Social History   Substance and Sexual Activity  Alcohol Use No    Family History:  Family History  Problem Relation Age of Onset   Hypertension Mother    Heart disease Father    Anxiety disorder Brother    Uterine cancer Maternal Grandmother    Heart disease Maternal Grandfather    Multiple sclerosis Paternal Grandmother    Heart disease Paternal Grandfather     Past medical history, surgical history, medications, allergies, family history and social history reviewed with patient today and changes made to appropriate areas of the chart.   Review of Systems  Constitutional: Negative.   HENT: Negative.    Eyes: Negative.   Respiratory: Negative.    Cardiovascular: Negative.   Gastrointestinal: Negative.   Genitourinary: Negative.   Musculoskeletal: Negative.   Skin: Negative.   Neurological: Negative.    Endo/Heme/Allergies: Negative.   Psychiatric/Behavioral: Negative.    All other ROS negative except what is listed above and in the HPI.      Objective:    BP 101/68    Pulse 75    Temp 98.2 F (36.8 C)    Ht 5\' 9"  (1.753 Deborah)    Wt 231 lb 12.8 oz (105.1 kg)    SpO2 98%    BMI 34.23 kg/Deborah   Wt Readings from Last 3 Encounters:  07/21/21 231 lb 12.8 oz (105.1 kg)  01/03/21 230 lb (104.3 kg) (99 %, Z= 2.32)*  12/29/20 232 lb 12.8 oz (105.6 kg) (>99 %, Z= 2.35)*   * Growth percentiles are based on CDC (Girls, 2-20 Years) data.    Physical Exam Vitals and nursing note reviewed.  Constitutional:      General: She is not in acute distress.    Appearance: Normal appearance. She is not ill-appearing, toxic-appearing or diaphoretic.  HENT:     Head: Normocephalic and atraumatic.     Right Ear: Tympanic membrane, ear canal and external ear normal. There is no impacted cerumen.     Left Ear: Tympanic membrane, ear canal and external ear normal. There is no impacted cerumen.     Nose: Nose normal. No congestion or rhinorrhea.     Mouth/Throat:     Mouth: Mucous membranes are moist.     Pharynx: Oropharynx is clear. No oropharyngeal exudate or posterior oropharyngeal erythema.  Eyes:     General: No scleral icterus.       Right eye: No discharge.        Left eye: No discharge.     Extraocular Movements: Extraocular movements intact.     Conjunctiva/sclera: Conjunctivae normal.     Pupils: Pupils are equal, round, and reactive to light.  Neck:     Vascular: No carotid bruit.  Cardiovascular:     Rate and Rhythm: Normal rate and regular rhythm.     Pulses: Normal pulses.     Heart sounds: No murmur heard.   No friction rub. No gallop.  Pulmonary:     Effort: Pulmonary effort is normal. No respiratory distress.     Breath sounds: Normal breath sounds. No stridor. No wheezing, rhonchi or rales.  Chest:     Chest wall: No tenderness.  Breasts:    Left: Normal.    Abdominal:      General: Abdomen is flat. Bowel sounds are normal. There is no distension.     Palpations: Abdomen is soft. There is no mass.     Tenderness: There  is no abdominal tenderness. There is no right CVA tenderness, left CVA tenderness, guarding or rebound.     Hernia: No hernia is present.  Genitourinary:    Comments: Pelvic exams deferred with shared decision making Musculoskeletal:        General: No swelling, tenderness, deformity or signs of injury.     Cervical back: Normal range of motion and neck supple. No rigidity. No muscular tenderness.     Right lower leg: No edema.     Left lower leg: No edema.  Lymphadenopathy:     Cervical: No cervical adenopathy.  Skin:    General: Skin is warm and dry.     Capillary Refill: Capillary refill takes less than 2 seconds.     Coloration: Skin is not jaundiced or pale.     Findings: No bruising, erythema, lesion or rash.  Neurological:     General: No focal deficit present.     Mental Status: She is alert and oriented to person, place, and time. Mental status is at baseline.     Cranial Nerves: No cranial nerve deficit.     Sensory: No sensory deficit.     Motor: No weakness.     Coordination: Coordination normal.     Gait: Gait normal.     Deep Tendon Reflexes: Reflexes normal.  Psychiatric:        Mood and Affect: Mood normal.        Behavior: Behavior normal.        Thought Content: Thought content normal.        Judgment: Judgment normal.    Results for orders placed or performed in visit on 12/29/20  Urinalysis, Routine w reflex microscopic  Result Value Ref Range   Specific Gravity, UA 1.020 1.005 - 1.030   pH, UA 6.0 5.0 - 7.5   Color, UA Yellow Yellow   Appearance Ur Cloudy (A) Clear   Leukocytes,UA Negative Negative   Protein,UA Negative Negative/Trace   Glucose, UA Negative Negative   Ketones, UA Trace (A) Negative   RBC, UA Negative Negative   Bilirubin, UA Negative Negative   Urobilinogen, Ur 0.2 0.2 - 1.0 mg/dL    Nitrite, UA Negative Negative  CBC with Differential/Platelet  Result Value Ref Range   WBC 5.7 3.4 - 10.8 x10E3/uL   RBC 4.77 3.77 - 5.28 x10E6/uL   Hemoglobin 14.6 11.1 - 15.9 g/dL   Hematocrit 42.6 34.0 - 46.6 %   MCV 89 79 - 97 fL   MCH 30.6 26.6 - 33.0 pg   MCHC 34.3 31.5 - 35.7 g/dL   RDW 12.3 11.7 - 15.4 %   Platelets 197 150 - 450 x10E3/uL   Neutrophils 53 Not Estab. %   Lymphs 36 Not Estab. %   Monocytes 8 Not Estab. %   Eos 2 Not Estab. %   Basos 1 Not Estab. %   Neutrophils Absolute 3.1 1.4 - 7.0 x10E3/uL   Lymphocytes Absolute 2.0 0.7 - 3.1 x10E3/uL   Monocytes Absolute 0.5 0.1 - 0.9 x10E3/uL   EOS (ABSOLUTE) 0.1 0.0 - 0.4 x10E3/uL   Basophils Absolute 0.0 0.0 - 0.2 x10E3/uL   Immature Granulocytes 0 Not Estab. %   Immature Grans (Abs) 0.0 0.0 - 0.1 x10E3/uL  QuantiFERON-TB Gold Plus  Result Value Ref Range   QuantiFERON Incubation Incubation performed.    QuantiFERON Criteria Comment    QuantiFERON TB1 Ag Value 3.33 IU/mL   QuantiFERON TB2 Ag Value 0.01 IU/mL   QuantiFERON Nil Value  0.00 IU/mL   QuantiFERON Mitogen Value >10.00 IU/mL   QuantiFERON-TB Gold Plus Positive (A) Negative      Assessment & Plan:   Problem List Items Addressed This Visit       Cardiovascular and Mediastinum   Migraine without aura    Under good control on current regimen. Continue current regimen. Continue to monitor. Call with any concerns. Refills given.        Relevant Medications   butalbital-acetaminophen-caffeine (FIORICET) 50-325-40 MG tablet   venlafaxine (EFFEXOR) 37.5 MG tablet   Fremanezumab-vfrm (AJOVY) 225 MG/1.5ML SOAJ   Topiramate ER (TROKENDI XR) 100 MG CP24   Other Visit Diagnoses     Routine general medical examination at a health care facility    -  Primary   Vaccines updated. Screening labs checked today. Continue diet and exercise. Call with any concerns. Continue to monitor.    Relevant Orders   CBC with Differential/Platelet   Comprehensive  metabolic panel   Lipid Panel w/o Chol/HDL Ratio   Urinalysis, Routine w reflex microscopic   TSH   GC/Chlamydia Probe Amp   HIV Antibody (routine testing w rflx)   Hepatitis C Antibody   Mass of left axilla       Will check [place]. Await results and treat as needed.    Relevant Orders   US BREAST LTD UNI LEFT INC AXILLA        Follow up plan: Return in about 6 months (around 01/19/2022) for migraines.   LABORATORY TESTING:  - Pap smear: not applicable  IMMUNIZATIONS:   - Tdap: Tetanus vaccination status reviewed: last tetanus booster within 10 years. - Influenza: Up to date - Pneumovax: Up to date - Prevnar: Not applicable - COVID: Up to date - HPV: Up to date   PATIENT COUNSELING:   Advised to take 1 mg of folate supplement per day if capable of pregnancy.   Sexuality: Discussed sexually transmitted diseases, partner selection, use of condoms, avoidance of unintended pregnancy  and contraceptive alternatives.   Advised to avoid cigarette smoking.  I discussed with the patient that most people either abstain from alcohol or drink within safe limits (<=14/week and <=4 drinks/occasion for males, <=7/weeks and <= 3 drinks/occasion for females) and that the risk for alcohol disorders and other health effects rises proportionally with the number of drinks per week and how often a drinker exceeds daily limits.  Discussed cessation/primary prevention of drug use and availability of treatment for abuse.   Diet: Encouraged to adjust caloric intake to maintain  or achieve ideal body weight, to reduce intake of dietary saturated fat and total fat, to limit sodium intake by avoiding high sodium foods and not adding table salt, and to maintain adequate dietary potassium and calcium preferably from fresh fruits, vegetables, and low-fat dairy products.    stressed the importance of regular exercise  Injury prevention: Discussed safety belts, safety helmets, smoke detector, smoking near  bedding or upholstery.   Dental health: Discussed importance of regular tooth brushing, flossing, and dental visits.    NEXT PREVENTATIVE PHYSICAL DUE IN 1 YEAR. Return in about 6 months (around 01/19/2022) for migraines.

## 2021-07-22 LAB — LIPID PANEL W/O CHOL/HDL RATIO
Cholesterol, Total: 207 mg/dL — ABNORMAL HIGH (ref 100–199)
HDL: 63 mg/dL (ref >39)
LDL Chol Calc (NIH): 115 mg/dL — ABNORMAL HIGH (ref 0–99)
Triglycerides: 170 mg/dL — ABNORMAL HIGH (ref 0–149)
VLDL Cholesterol Cal: 29 mg/dL (ref 5–40)

## 2021-07-22 LAB — COMPREHENSIVE METABOLIC PANEL
ALT: 13 IU/L (ref 0–32)
AST: 13 IU/L (ref 0–40)
Albumin/Globulin Ratio: 1.6 (ref 1.2–2.2)
Albumin: 4.5 g/dL (ref 3.9–5.0)
Alkaline Phosphatase: 81 IU/L (ref 42–106)
BUN/Creatinine Ratio: 16 (ref 9–23)
BUN: 12 mg/dL (ref 6–20)
Bilirubin Total: 0.3 mg/dL (ref 0.0–1.2)
CO2: 21 mmol/L (ref 20–29)
Calcium: 9.4 mg/dL (ref 8.7–10.2)
Chloride: 104 mmol/L (ref 96–106)
Creatinine, Ser: 0.76 mg/dL (ref 0.57–1.00)
Globulin, Total: 2.8 g/dL (ref 1.5–4.5)
Glucose: 81 mg/dL (ref 70–99)
Potassium: 4 mmol/L (ref 3.5–5.2)
Sodium: 140 mmol/L (ref 134–144)
Total Protein: 7.3 g/dL (ref 6.0–8.5)
eGFR: 115 mL/min/{1.73_m2} (ref >59)

## 2021-07-22 LAB — CBC WITH DIFFERENTIAL/PLATELET
Basophils Absolute: 0 10*3/uL (ref 0.0–0.2)
Basos: 1 %
EOS (ABSOLUTE): 0.2 10*3/uL (ref 0.0–0.4)
Eos: 4 %
Hematocrit: 44.7 % (ref 34.0–46.6)
Hemoglobin: 15 g/dL (ref 11.1–15.9)
Immature Grans (Abs): 0 10*3/uL (ref 0.0–0.1)
Immature Granulocytes: 0 %
Lymphocytes Absolute: 2 10*3/uL (ref 0.7–3.1)
Lymphs: 37 %
MCH: 30.1 pg (ref 26.6–33.0)
MCHC: 33.6 g/dL (ref 31.5–35.7)
MCV: 90 fL (ref 79–97)
Monocytes Absolute: 0.3 10*3/uL (ref 0.1–0.9)
Monocytes: 5 %
Neutrophils Absolute: 2.9 10*3/uL (ref 1.4–7.0)
Neutrophils: 53 %
Platelets: 208 10*3/uL (ref 150–450)
RBC: 4.99 x10E6/uL (ref 3.77–5.28)
RDW: 12.5 % (ref 11.7–15.4)
WBC: 5.5 10*3/uL (ref 3.4–10.8)

## 2021-07-22 LAB — HIV ANTIBODY (ROUTINE TESTING W REFLEX): HIV Screen 4th Generation wRfx: NONREACTIVE

## 2021-07-22 LAB — URINALYSIS, ROUTINE W REFLEX MICROSCOPIC
Bilirubin, UA: NEGATIVE
Glucose, UA: NEGATIVE
Ketones, UA: NEGATIVE
Leukocytes,UA: NEGATIVE
Nitrite, UA: NEGATIVE
Protein,UA: NEGATIVE
RBC, UA: NEGATIVE
Specific Gravity, UA: 1.021 (ref 1.005–1.030)
Urobilinogen, Ur: 0.2 mg/dL (ref 0.2–1.0)
pH, UA: 6 (ref 5.0–7.5)

## 2021-07-22 LAB — HEPATITIS C ANTIBODY: Hep C Virus Ab: <0.1 s/co ratio (ref 0.0–0.9)

## 2021-07-22 LAB — TSH: TSH: 1.1 u[IU]/mL (ref 0.450–4.500)

## 2021-07-23 LAB — GC/CHLAMYDIA PROBE AMP
Chlamydia trachomatis, NAA: NEGATIVE
Neisseria Gonorrhoeae by PCR: NEGATIVE

## 2021-08-09 ENCOUNTER — Telehealth: Payer: Self-pay

## 2021-08-09 NOTE — Telephone Encounter (Signed)
ERROR

## 2021-12-18 ENCOUNTER — Other Ambulatory Visit: Payer: Self-pay | Admitting: Family Medicine

## 2021-12-19 NOTE — Telephone Encounter (Signed)
Requested Prescriptions  ?Pending Prescriptions Disp Refills  ?? SPRINTEC 28 0.25-35 MG-MCG tablet [Pharmacy Med Name: SPRINTEC 28 DAY TABLET] 84 tablet 3  ?  Sig: TAKE 1 TABLET BY MOUTH EVERY DAY  ?  ? OB/GYN:  Contraceptives Passed - 12/18/2021 12:23 AM  ?  ?  Passed - Last BP in normal range  ?  BP Readings from Last 1 Encounters:  ?07/21/21 101/68  ?   ?  ?  Passed - Valid encounter within last 12 months  ?  Recent Outpatient Visits   ?      ? 5 months ago Routine general medical examination at a health care facility  ? [facility] [place], Megan P, DO  ? 11 months ago Migraine without aura and without status migrainosus, not intractable  ? [facility] [place], Megan P, DO  ? 1 year ago Migraine without aura and without status migrainosus, not intractable  ? [facility] [place], Megan P, DO  ? 1 year ago Migraine without aura and without status migrainosus, not intractable  ? [facility] [place], Megan P, DO  ? 1 year ago Migraine without aura and without status migrainosus, not intractable  ? [facility] [place], [place] P, DO  ?  ?  ?Future Appointments   ?        ? In 1 month Johnson, Oralia Rud, DO [facility]  ?  ? ?  ?  ?  Passed - Patient is not a smoker  ?  ?  ? ?

## 2022-01-19 ENCOUNTER — Encounter: Payer: Self-pay | Admitting: Family Medicine

## 2022-01-19 ENCOUNTER — Ambulatory Visit: Payer: BC Managed Care – PPO | Admitting: Family Medicine

## 2022-01-19 DIAGNOSIS — G43009 Migraine without aura, not intractable, without status migrainosus: Secondary | ICD-10-CM

## 2022-01-19 MED ORDER — EMGALITY 120 MG/ML ~~LOC~~ SOSY: 120.0000 mg | PREFILLED_SYRINGE | SUBCUTANEOUS | 6 refills | Status: DC

## 2022-01-19 MED ORDER — SPRINTEC 28 0.25-35 MG-MCG PO TABS: 1.0000 | ORAL_TABLET | Freq: Every day | ORAL | 3 refills | Status: DC

## 2022-01-19 NOTE — Assessment & Plan Note (Signed)
Ajovy is not working. Wearing off after a couple of weeks. Will change her to emgality and see how it does. Sample given today. Recheck 4-6 weeks.

## 2022-01-19 NOTE — Progress Notes (Signed)
BP 101/68   Pulse 87   Temp 98.5 F (36.9 C)   Wt 249 lb 9.6 oz (113.2 kg)   LMP 01/10/2022 (Exact Date)   SpO2 99%   BMI 36.86 kg/m    Subjective:    Patient ID: Deborah Summers, female    DOB: 2001/06/16, 21 y.o.   MRN: [id number]  HPI: Deborah M Mccarron is a 21 y.o. female  Chief Complaint  Patient presents with   Migraine    Patient states her migraines are worsening    MIGRAINES- ajovy only working 2-3 weeks now and she's having severe headaches the last 1-2 weeks of the month.  Duration: chronic- worsening Onset: sudden Severity: severe Quality: aching and sharp Frequency: 1x a week recently Location: behind her eye Headache duration: hours Radiation: no Time of day headache occurs: at random Headache status at time of visit: asymptomatic Treatments attempted: Treatments attempted:  ubrelvy, rest, ice, heat, APAP, ibuprofen, aleve", excedrine, triptans, topamax, and amitriptyline aimovig, ajovy Aura: no Nausea:  yes Vomiting: yes Photophobia:  yes Phonophobia:  yes Effect on social functioning:  yes Numbers of missed days of school/work each month:  Confusion:  no Gait disturbance/ataxia:  no Behavioral changes:  no Fevers:  no  Relevant past medical, surgical, family and social history reviewed and updated as indicated. Interim medical history since our last visit reviewed. Allergies and medications reviewed and updated.  Review of Systems  Constitutional: Negative.   Respiratory: Negative.    Cardiovascular: Negative.   Gastrointestinal: Negative.   Musculoskeletal: Negative.   Neurological:  Positive for headaches. Negative for dizziness, tremors, seizures, syncope, facial asymmetry, speech difficulty, weakness, light-headedness and numbness.  Psychiatric/Behavioral: Negative.      Per HPI unless specifically indicated above     Objective:    BP 101/68   Pulse 87   Temp 98.5 F (36.9 C)   Wt 249 lb 9.6 oz (113.2 kg)   LMP 01/10/2022 (Exact  Date)   SpO2 99%   BMI 36.86 kg/m   Wt Readings from Last 3 Encounters:  01/19/22 249 lb 9.6 oz (113.2 kg)  07/21/21 231 lb 12.8 oz (105.1 kg)  01/03/21 230 lb (104.3 kg) (99 %, Z= 2.32)*   * Growth percentiles are based on CDC (Girls, 2-20 Years) data.    Physical Exam Vitals and nursing note reviewed.  Constitutional:      General: She is not in acute distress.    Appearance: Normal appearance. She is not ill-appearing, toxic-appearing or diaphoretic.  HENT:     Head: Normocephalic and atraumatic.     Right Ear: External ear normal.     Left Ear: External ear normal.     Nose: Nose normal.     Mouth/Throat:     Mouth: Mucous membranes are moist.     Pharynx: Oropharynx is clear.  Eyes:     General: No scleral icterus.       Right eye: No discharge.        Left eye: No discharge.     Extraocular Movements: Extraocular movements intact.     Conjunctiva/sclera: Conjunctivae normal.     Pupils: Pupils are equal, round, and reactive to light.  Cardiovascular:     Rate and Rhythm: Normal rate and regular rhythm.     Pulses: Normal pulses.     Heart sounds: Normal heart sounds. No murmur heard.    No friction rub. No gallop.  Pulmonary:     Effort: Pulmonary effort is normal.  No respiratory distress.     Breath sounds: Normal breath sounds. No stridor. No wheezing, rhonchi or rales.  Chest:     Chest wall: No tenderness.  Musculoskeletal:        General: Normal range of motion.     Cervical back: Normal range of motion and neck supple.  Skin:    General: Skin is warm and dry.     Capillary Refill: Capillary refill takes less than 2 seconds.     Coloration: Skin is not jaundiced or pale.     Findings: No bruising, erythema, lesion or rash.  Neurological:     General: No focal deficit present.     Mental Status: She is alert and oriented to person, place, and time. Mental status is at baseline.  Psychiatric:        Mood and Affect: Mood normal.        Behavior: Behavior  normal.        Thought Content: Thought content normal.        Judgment: Judgment normal.     Results for orders placed or performed in visit on 07/21/21  GC/Chlamydia Probe Amp   Specimen: Urine   UR  Result Value Ref Range   Chlamydia trachomatis, NAA Negative Negative   Neisseria Gonorrhoeae by PCR Negative Negative  CBC with Differential/Platelet  Result Value Ref Range   WBC 5.5 3.4 - 10.8 x10E3/uL   RBC 4.99 3.77 - 5.28 x10E6/uL   Hemoglobin 15.0 11.1 - 15.9 g/dL   Hematocrit 44.7 34.0 - 46.6 %   MCV 90 79 - 97 fL   MCH 30.1 26.6 - 33.0 pg   MCHC 33.6 31.5 - 35.7 g/dL   RDW 12.5 11.7 - 15.4 %   Platelets 208 150 - 450 x10E3/uL   Neutrophils 53 Not Estab. %   Lymphs 37 Not Estab. %   Monocytes 5 Not Estab. %   Eos 4 Not Estab. %   Basos 1 Not Estab. %   Neutrophils Absolute 2.9 1.4 - 7.0 x10E3/uL   Lymphocytes Absolute 2.0 0.7 - 3.1 x10E3/uL   Monocytes Absolute 0.3 0.1 - 0.9 x10E3/uL   EOS (ABSOLUTE) 0.2 0.0 - 0.4 x10E3/uL   Basophils Absolute 0.0 0.0 - 0.2 x10E3/uL   Immature Granulocytes 0 Not Estab. %   Immature Grans (Abs) 0.0 0.0 - 0.1 x10E3/uL  Comprehensive metabolic panel  Result Value Ref Range   Glucose 81 70 - 99 mg/dL   BUN 12 6 - 20 mg/dL   Creatinine, Ser 0.76 0.57 - 1.00 mg/dL   eGFR 115 >59 mL/min/1.73   BUN/Creatinine Ratio 16 9 - 23   Sodium 140 134 - 144 mmol/L   Potassium 4.0 3.5 - 5.2 mmol/L   Chloride 104 96 - 106 mmol/L   CO2 21 20 - 29 mmol/L   Calcium 9.4 8.7 - 10.2 mg/dL   Total Protein 7.3 6.0 - 8.5 g/dL   Albumin 4.5 3.9 - 5.0 g/dL   Globulin, Total 2.8 1.5 - 4.5 g/dL   Albumin/Globulin Ratio 1.6 1.2 - 2.2   Bilirubin Total 0.3 0.0 - 1.2 mg/dL   Alkaline Phosphatase 81 42 - 106 IU/L   AST 13 0 - 40 IU/L   ALT 13 0 - 32 IU/L  Lipid Panel w/o Chol/HDL Ratio  Result Value Ref Range   Cholesterol, Total 207 (H) 100 - 199 mg/dL   Triglycerides 170 (H) 0 - 149 mg/dL   HDL 63 >39 mg/dL   VLDL  Cholesterol Cal 29 5 - 40 mg/dL    LDL Chol Calc (NIH) 115 (H) 0 - 99 mg/dL  Urinalysis, Routine w reflex microscopic  Result Value Ref Range   Specific Gravity, UA 1.021 1.005 - 1.030   pH, UA 6.0 5.0 - 7.5   Color, UA Yellow Yellow   Appearance Ur Clear Clear   Leukocytes,UA Negative Negative   Protein,UA Negative Negative/Trace   Glucose, UA Negative Negative   Ketones, UA Negative Negative   RBC, UA Negative Negative   Bilirubin, UA Negative Negative   Urobilinogen, Ur 0.2 0.2 - 1.0 mg/dL   Nitrite, UA Negative Negative   Microscopic Examination Comment   TSH  Result Value Ref Range   TSH 1.100 0.450 - 4.500 uIU/mL  HIV Antibody (routine testing w rflx)  Result Value Ref Range   HIV Screen 4th Generation wRfx Non Reactive Non Reactive  Hepatitis C Antibody  Result Value Ref Range   Hep C Virus Ab <0.1 0.0 - 0.9 s/co ratio      Assessment & Plan:   Problem List Items Addressed This Visit       Cardiovascular and Mediastinum   Migraine without aura    Ajovy is not working. Wearing off after a couple of weeks. Will change her to emgality and see how it does. Sample given today. Recheck 4-6 weeks.       Relevant Medications   Galcanezumab-gnlm (EMGALITY) 120 MG/ML SOSY     Follow up plan: Return 6-8 weeks.

## 2022-02-23 ENCOUNTER — Encounter: Payer: Self-pay | Admitting: Family Medicine

## 2022-02-23 ENCOUNTER — Ambulatory Visit: Payer: BC Managed Care – PPO | Admitting: Family Medicine

## 2022-02-23 DIAGNOSIS — G43009 Migraine without aura, not intractable, without status migrainosus: Secondary | ICD-10-CM | POA: Diagnosis not present

## 2022-02-23 NOTE — Assessment & Plan Note (Signed)
Under good control on current regimen. Continue current regimen. Continue to monitor. Call with any concerns. Refills given.   

## 2022-02-23 NOTE — Progress Notes (Signed)
BP 113/74   Pulse 74   Temp 98.2 F (36.8 C)   Wt 256 lb (116.1 kg)   SpO2 98%   BMI 37.80 kg/m    Subjective:    Patient ID: Deborah Summers, female    DOB: [birth year], 21 y.o.   MRN: [id number]  HPI: Deborah Summers is a 21 y.o. female  Chief Complaint  Patient presents with   Migraine    Patient states her migraines are better, medication seems to be helping   MIGRAINES Duration: chronic Onset: sudden Severity: moderate Quality: sharp Frequency: 1x a week Location: behind her eye Headache duration: hours Radiation: no Time of day headache occurs: evening Headache status at time of visit: asymptomatic Treatments attempted: Treatments attempted: rest, ice, heat, APAP, ibuprofen, aleve", excedrine, triptans, topamax, and amitriptyline   Aura: no Nausea:  yes Vomiting: yes Photophobia:  yes Phonophobia:  yes Effect on social functioning:  yes Confusion:  no Gait disturbance/ataxia:  no Behavioral changes:  no Fevers:  no  Relevant past medical, surgical, family and social history reviewed and updated as indicated. Interim medical history since our last visit reviewed. Allergies and medications reviewed and updated.  Review of Systems  Constitutional: Negative.   Respiratory: Negative.    Cardiovascular: Negative.   Gastrointestinal: Negative.   Musculoskeletal: Negative.   Neurological:  Positive for headaches. Negative for dizziness, tremors, seizures, syncope, facial asymmetry, speech difficulty, weakness, light-headedness and numbness.  Psychiatric/Behavioral: Negative.      Per HPI unless specifically indicated above     Objective:    BP 113/74   Pulse 74   Temp 98.2 F (36.8 C)   Wt 256 lb (116.1 kg)   SpO2 98%   BMI 37.80 kg/m   Wt Readings from Last 3 Encounters:  02/23/22 256 lb (116.1 kg)  01/19/22 249 lb 9.6 oz (113.2 kg)  07/21/21 231 lb 12.8 oz (105.1 kg)    Physical Exam Vitals and nursing note reviewed.  Constitutional:       General: She is not in acute distress.    Appearance: Normal appearance. She is not ill-appearing, toxic-appearing or diaphoretic.  HENT:     Head: Normocephalic and atraumatic.     Right Ear: External ear normal.     Left Ear: External ear normal.     Nose: Nose normal.     Mouth/Throat:     Mouth: Mucous membranes are moist.     Pharynx: Oropharynx is clear.  Eyes:     General: No scleral icterus.       Right eye: No discharge.        Left eye: No discharge.     Extraocular Movements: Extraocular movements intact.     Conjunctiva/sclera: Conjunctivae normal.     Pupils: Pupils are equal, round, and reactive to light.  Cardiovascular:     Rate and Rhythm: Normal rate and regular rhythm.     Pulses: Normal pulses.     Heart sounds: Normal heart sounds. No murmur heard.    No friction rub. No gallop.  Pulmonary:     Effort: Pulmonary effort is normal. No respiratory distress.     Breath sounds: Normal breath sounds. No stridor. No wheezing, rhonchi or rales.  Chest:     Chest wall: No tenderness.  Musculoskeletal:        General: Normal range of motion.     Cervical back: Normal range of motion and neck supple.  Skin:    General: Skin is  warm and dry.     Capillary Refill: Capillary refill takes less than 2 seconds.     Coloration: Skin is not jaundiced or pale.     Findings: No bruising, erythema, lesion or rash.  Neurological:     General: No focal deficit present.     Mental Status: She is alert and oriented to person, place, and time. Mental status is at baseline.  Psychiatric:        Mood and Affect: Mood normal.        Behavior: Behavior normal.        Thought Content: Thought content normal.        Judgment: Judgment normal.     Results for orders placed or performed in visit on 07/21/21  GC/Chlamydia Probe Amp   Specimen: Urine   UR  Result Value Ref Range   Chlamydia trachomatis, NAA Negative Negative   Neisseria Gonorrhoeae by PCR Negative Negative  CBC  with Differential/Platelet  Result Value Ref Range   WBC 5.5 3.4 - 10.8 x10E3/uL   RBC 4.99 3.77 - 5.28 x10E6/uL   Hemoglobin 15.0 11.1 - 15.9 g/dL   Hematocrit 44.7 34.0 - 46.6 %   MCV 90 79 - 97 fL   MCH 30.1 26.6 - 33.0 pg   MCHC 33.6 31.5 - 35.7 g/dL   RDW 12.5 11.7 - 15.4 %   Platelets 208 150 - 450 x10E3/uL   Neutrophils 53 Not Estab. %   Lymphs 37 Not Estab. %   Monocytes 5 Not Estab. %   Eos 4 Not Estab. %   Basos 1 Not Estab. %   Neutrophils Absolute 2.9 1.4 - 7.0 x10E3/uL   Lymphocytes Absolute 2.0 0.7 - 3.1 x10E3/uL   Monocytes Absolute 0.3 0.1 - 0.9 x10E3/uL   EOS (ABSOLUTE) 0.2 0.0 - 0.4 x10E3/uL   Basophils Absolute 0.0 0.0 - 0.2 x10E3/uL   Immature Granulocytes 0 Not Estab. %   Immature Grans (Abs) 0.0 0.0 - 0.1 x10E3/uL  Comprehensive metabolic panel  Result Value Ref Range   Glucose 81 70 - 99 mg/dL   BUN 12 6 - 20 mg/dL   Creatinine, Ser 0.76 0.57 - 1.00 mg/dL   eGFR 115 >59 mL/min/1.73   BUN/Creatinine Ratio 16 9 - 23   Sodium 140 134 - 144 mmol/L   Potassium 4.0 3.5 - 5.2 mmol/L   Chloride 104 96 - 106 mmol/L   CO2 21 20 - 29 mmol/L   Calcium 9.4 8.7 - 10.2 mg/dL   Total Protein 7.3 6.0 - 8.5 g/dL   Albumin 4.5 3.9 - 5.0 g/dL   Globulin, Total 2.8 1.5 - 4.5 g/dL   Albumin/Globulin Ratio 1.6 1.2 - 2.2   Bilirubin Total 0.3 0.0 - 1.2 mg/dL   Alkaline Phosphatase 81 42 - 106 IU/L   AST 13 0 - 40 IU/L   ALT 13 0 - 32 IU/L  Lipid Panel w/o Chol/HDL Ratio  Result Value Ref Range   Cholesterol, Total 207 (H) 100 - 199 mg/dL   Triglycerides 170 (H) 0 - 149 mg/dL   HDL 63 >39 mg/dL   VLDL Cholesterol Cal 29 5 - 40 mg/dL   LDL Chol Calc (NIH) 115 (H) 0 - 99 mg/dL  Urinalysis, Routine w reflex microscopic  Result Value Ref Range   Specific Gravity, UA 1.021 1.005 - 1.030   pH, UA 6.0 5.0 - 7.5   Color, UA Yellow Yellow   Appearance Ur Clear Clear   Leukocytes,UA Negative Negative  Protein,UA Negative Negative/Trace   Glucose, UA Negative Negative    Ketones, UA Negative Negative   RBC, UA Negative Negative   Bilirubin, UA Negative Negative   Urobilinogen, Ur 0.2 0.2 - 1.0 mg/dL   Nitrite, UA Negative Negative   Microscopic Examination Comment   TSH  Result Value Ref Range   TSH 1.100 0.450 - 4.500 uIU/mL  HIV Antibody (routine testing w rflx)  Result Value Ref Range   HIV Screen 4th Generation wRfx Non Reactive Non Reactive  Hepatitis C Antibody  Result Value Ref Range   Hep C Virus Ab <0.1 0.0 - 0.9 s/co ratio      Assessment & Plan:   Problem List Items Addressed This Visit       Cardiovascular and Mediastinum   Migraine without aura    Under good control on current regimen. Continue current regimen. Continue to monitor. Call with any concerns. Refills given.          Follow up plan: Return in about 6 months (around 08/26/2022) for physical.

## 2022-04-25 IMAGING — MR MR HEAD W/O CM
13 of 17 series · 40 of 48 positions shown · non-contrast
Comparison: None.

CLINICAL DATA: Headache

EXAM:
MRI HEAD WITHOUT CONTRAST
TECHNIQUE: Multiplanar, multiecho pulse sequences of the brain and surrounding
structures were obtained without intravenous contrast.

[Series 5: ax dwi_tracew · axial · 3.0mm · 0.60mm/px · z∈[-102,+53]mm · 5 of 96 slices shown]
[im 1/96]
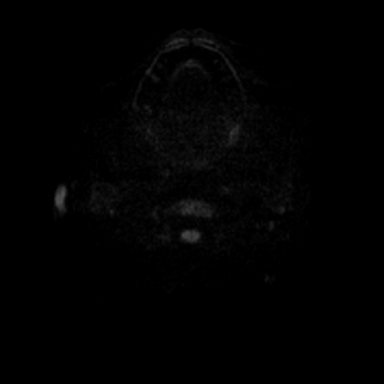
[im 24/96]
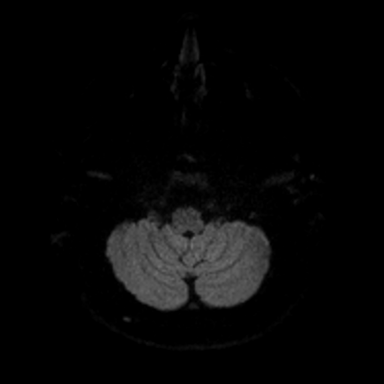
[im 48/96]
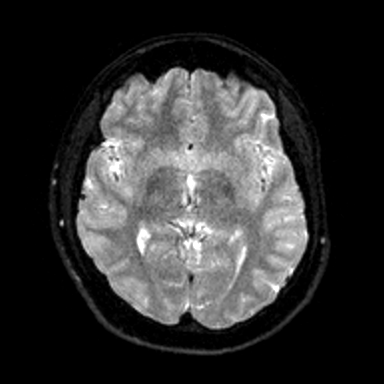
[im 72/96]
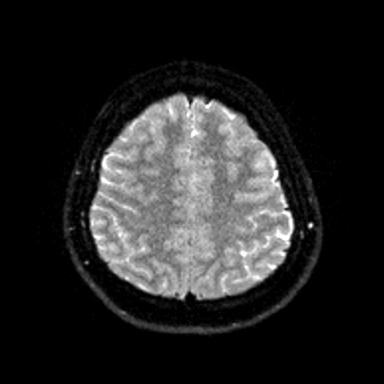
[im 96/96]
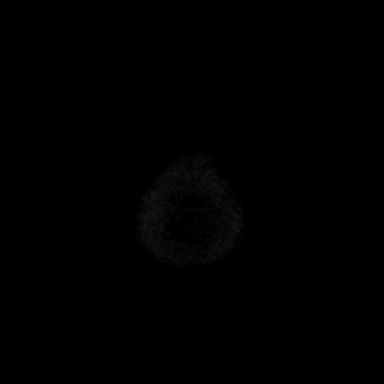

[Series 6: ax dwi_adc · axial · 3.0mm · 0.60mm/px · z∈[-102,+53]mm · 2 of 48 slices shown]
[im 1/48]
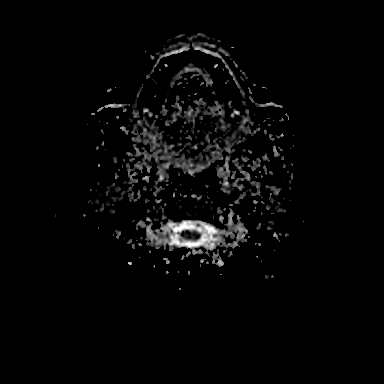
[im 48/48]
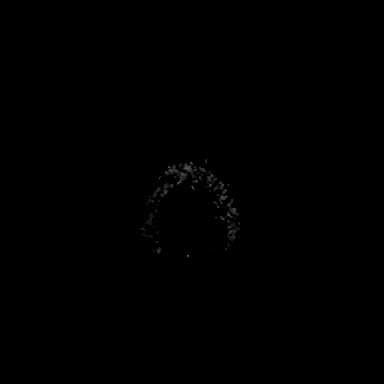

[Series 7: cor dwi_tracew · coronal · 5.0mm · 0.60mm/px · 4 of 76 slices shown]
[im 1/76]
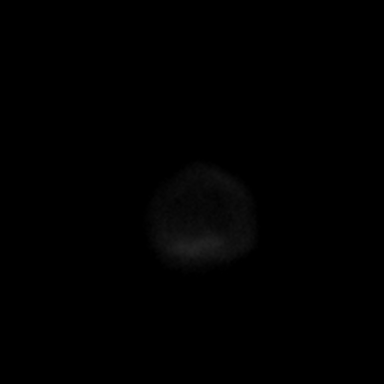
[im 26/76]
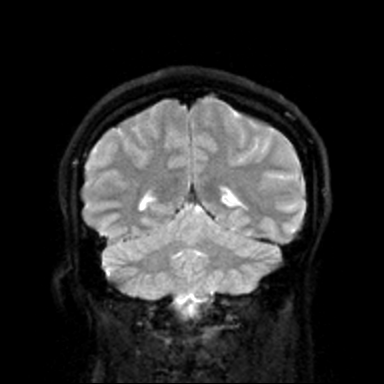
[im 51/76]
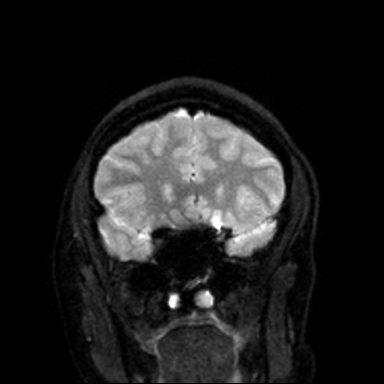
[im 76/76]
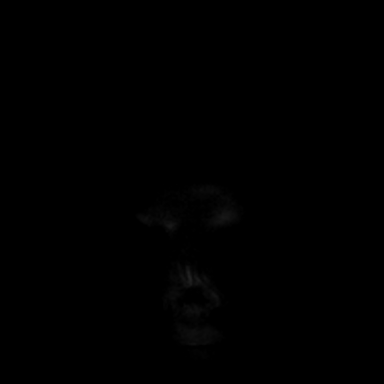

[Series 8: cor dwi_adc · coronal · 5.0mm · 0.60mm/px · 2 of 38 slices shown]
[im 1/38]
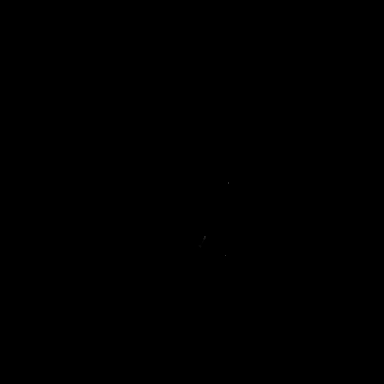
[im 38/38]
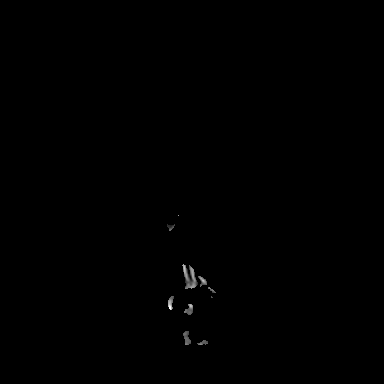

[Series 9: T1 · sagittal · 5.0mm · 0.62mm/px · 1 of 25 slices shown (1 of 2)]
[im 1/25]
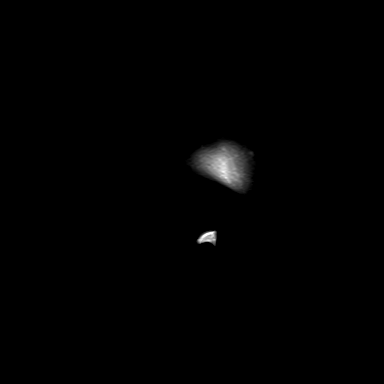

[Series 10: T2 · axial · 5.0mm · 0.53mm/px · 1 of 25 slices shown (1 of 2)]
[im 1/25]
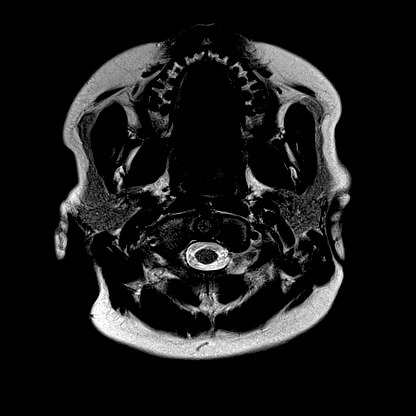

[Series 11: mag_images · axial · 3.0mm · 0.90mm/px · z∈[-114,+63]mm · 3 of 60 slices shown]
[im 1/60]
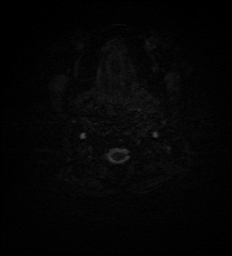
[im 30/60]
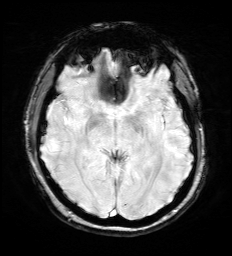
[im 60/60]
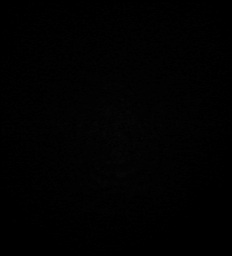

[Series 12: pha_images · axial · 3.0mm · 0.90mm/px · z∈[-114,+63]mm · 3 of 59 slices shown]
[im 1/59]
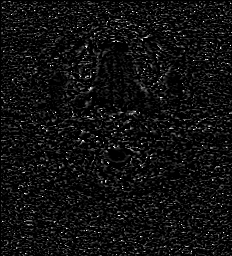
[im 30/59]
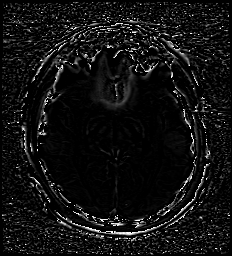
[im 59/59]
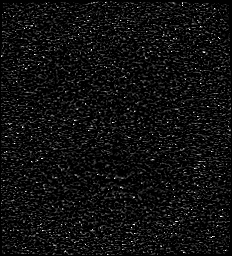

[Series 13: swi_images · axial · 3.0mm · 0.90mm/px · z∈[-114,+63]mm · 3 of 60 slices shown]
[im 1/60]
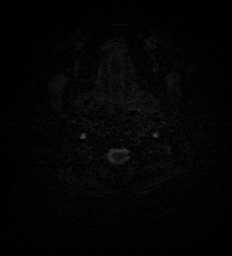
[im 30/60]
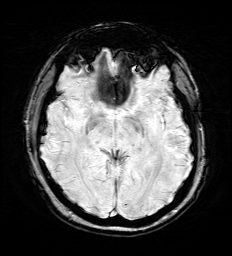
[im 60/60]
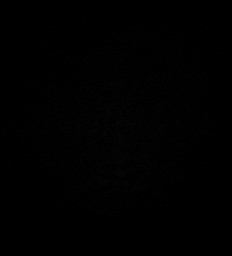

[Series 14: mip_images(sw) · axial · 24.0mm · 0.90mm/px · z∈[-103,+52]mm · 3 of 53 slices shown]
[im 1/53]
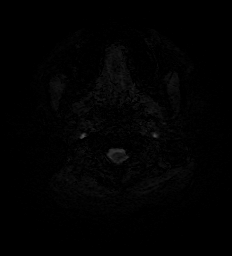
[im 27/53]
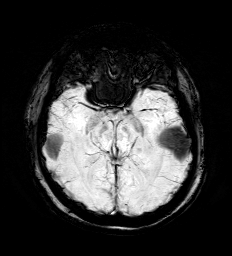
[im 53/53]
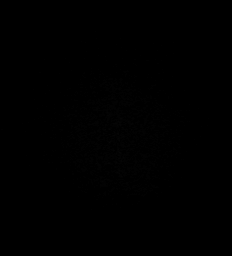

[Series 15: FLAIR · axial · 3.0mm · 0.53mm/px · z∈[-106,+56]mm · 3 of 55 slices shown]
[im 1/55]
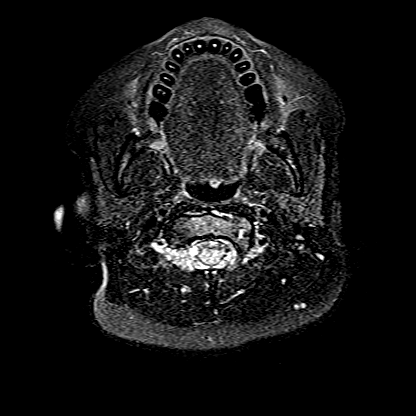
[im 28/55]
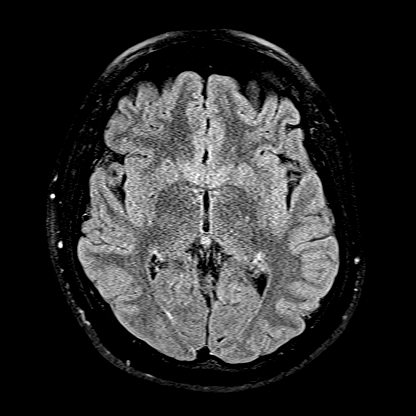
[im 55/55]
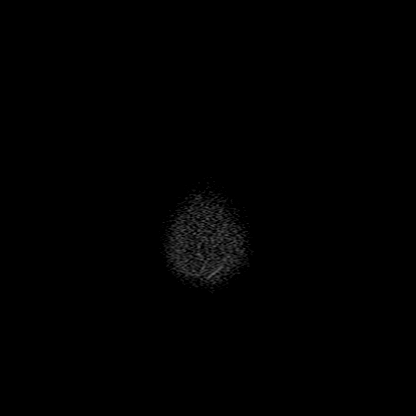

[Series 16: T1 · axial · 1.0mm · 0.98mm/px · z∈[-112,+63]mm · 9 of 176 slices shown (2 of 2)]
[im 1/176]
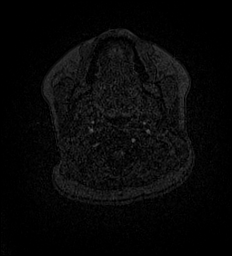
[im 22/176]
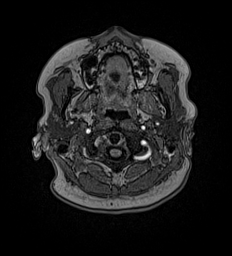
[im 44/176]
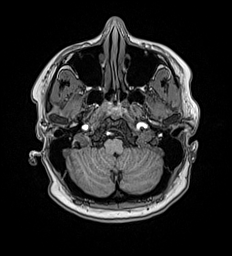
[im 66/176]
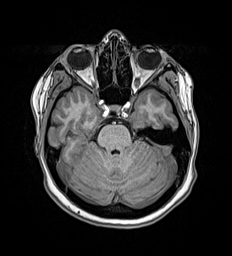
[im 88/176]
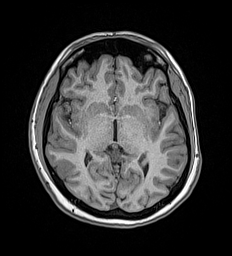
[im 110/176]
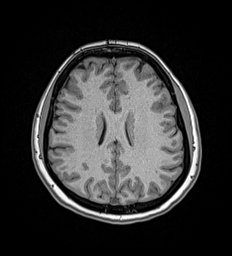
[im 132/176]
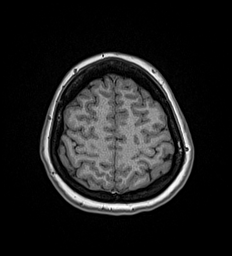
[im 154/176]
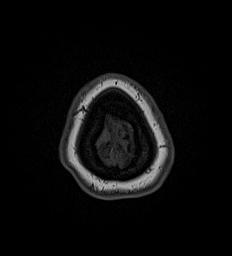
[im 176/176]
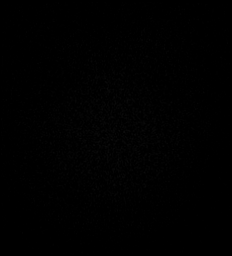

[Series 17: T2 · coronal · 5.0mm · 0.57mm/px · 1 of 29 slices shown (2 of 2)]
[im 1/29]
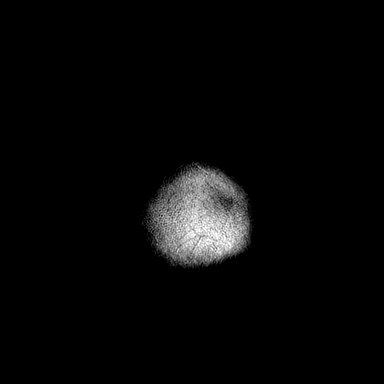

[40 of 48 positions shown; findings below may reference images not displayed]

FINDINGS: Brain: No focal parenchymal signal abnormality. No acute infarct or
intracranial hemorrhage. No midline shift, ventriculomegaly or
extra-axial fluid collection. No mass lesion. Normal appearance of
the pituitary gland and midline structures.

Vascular: Normal flow voids.

Skull and upper cervical spine: Normal marrow signal.

Sinuses/Orbits: Normal orbits. Clear paranasal sinuses. No mastoid
effusion.

Other: None.
IMPRESSION: Normal MRI brain.

No acute intracranial process.

## 2022-05-20 ENCOUNTER — Encounter: Payer: Self-pay | Admitting: Family Medicine

## 2022-05-25 NOTE — Telephone Encounter (Signed)
Yes please appt

## 2022-05-31 ENCOUNTER — Ambulatory Visit
Admission: RE | Admit: 2022-05-31 | Discharge: 2022-05-31 | Disposition: A | Discharge status: Home / Self Care | Payer: BC Managed Care – PPO | Source: Ambulatory Visit | Attending: Family Medicine | Admitting: Family Medicine

## 2022-05-31 ENCOUNTER — Other Ambulatory Visit: Payer: Self-pay | Admitting: Family Medicine

## 2022-05-31 DIAGNOSIS — R2232 Localized swelling, mass and lump, left upper limb: Secondary | ICD-10-CM

## 2022-05-31 NOTE — Progress Notes (Signed)
Contacted via [facility] [place], your ultrasound returned and is nice and normal.  No acute findings.  They recommend to start routine mammograms at age 21:)

## 2022-07-11 LAB — HM PAP SMEAR

## 2022-08-27 ENCOUNTER — Ambulatory Visit (INDEPENDENT_AMBULATORY_CARE_PROVIDER_SITE_OTHER): Payer: BC Managed Care – PPO | Admitting: Family Medicine

## 2022-08-27 ENCOUNTER — Encounter: Payer: Self-pay | Admitting: Family Medicine

## 2022-08-27 VITALS — BP 118/82 | HR 68 | Temp 97.8°F | Ht 69.0 in | Wt 262.5 lb

## 2022-08-27 DIAGNOSIS — Z6838 Body mass index (BMI) 38.0-38.9, adult: Secondary | ICD-10-CM | POA: Insufficient documentation

## 2022-08-27 DIAGNOSIS — Z0000 Encounter for general adult medical examination without abnormal findings: Secondary | ICD-10-CM | POA: Diagnosis not present

## 2022-08-27 DIAGNOSIS — G43009 Migraine without aura, not intractable, without status migrainosus: Secondary | ICD-10-CM | POA: Diagnosis not present

## 2022-08-27 LAB — URINALYSIS, ROUTINE W REFLEX MICROSCOPIC
Bilirubin, UA: NEGATIVE
Glucose, UA: NEGATIVE
Ketones, UA: NEGATIVE
Leukocytes,UA: NEGATIVE
Nitrite, UA: NEGATIVE
Protein,UA: NEGATIVE
Specific Gravity, UA: >1.03 — ABNORMAL HIGH (ref 1.005–1.030)
Urobilinogen, Ur: 0.2 mg/dL (ref 0.2–1.0)
pH, UA: 5 (ref 5.0–7.5)

## 2022-08-27 LAB — MICROSCOPIC EXAMINATION
Bacteria, UA: NONE SEEN
WBC, UA: NONE SEEN /hpf (ref 0–5)

## 2022-08-27 MED ORDER — SEMAGLUTIDE-WEIGHT MANAGEMENT 0.25 MG/0.5ML ~~LOC~~ SOAJ: 0.2500 mg | SUBCUTANEOUS | 0 refills | Status: AC

## 2022-08-27 MED ORDER — SEMAGLUTIDE-WEIGHT MANAGEMENT 0.5 MG/0.5ML ~~LOC~~ SOAJ: 0.5000 mg | SUBCUTANEOUS | 1 refills | Status: AC

## 2022-08-27 MED ORDER — EMGALITY 120 MG/ML ~~LOC~~ SOSY: 120.0000 mg | PREFILLED_SYRINGE | SUBCUTANEOUS | 6 refills | Status: DC

## 2022-08-27 NOTE — Assessment & Plan Note (Signed)
Under good control on current regimen. Continue current regimen. Continue to monitor. Call with any concerns. Refills given.   

## 2022-08-27 NOTE — Progress Notes (Signed)
BP 118/82   Pulse 68   Temp 97.8 F (36.6 C) (Oral)   Ht 5\' 9"  (1.753 Deborah)   Wt 262 lb 8 oz (119.1 kg)   LMP 08/25/2022   SpO2 99%   BMI 38.76 kg/Deborah    Subjective:    Patient ID: 08/27/2022 Deborah Summers, female    DOB: 12/06/2000, 22 y.o.   MRN: [id number]  HPI: [id number] Deborah Summers is a 22 y.o. female presenting on 08/27/2022 for comprehensive medical examination. Current medical complaints include:  MIGRAINES Duration: chronic Onset: sudden Severity: severe Quality: sharp Frequency: 1x a week Location: behind her eye Headache duration: hours Radiation: no Time of day headache occurs: evening Headache status at time of visit: asymptomatic Treatments attempted:  rest, ice, heat, APAP, ibuprofen, aleve", excedrine, triptans, topamax, and amitriptyline, ajovy   Aura: no   Nausea:  yes Vomiting: yes Photophobia:  yes Phonophobia:  yes Effect on social functioning:  yes Confusion:  no Gait disturbance/ataxia:  no Behavioral changes:  no Fevers:  no  OBESITY Duration: chronic Previous attempts at weight loss: yes Complications of obesity: none  Peak weight: current (262) Weight loss goal: to be healthy Weight loss to date: none Requesting obesity pharmacotherapy: yes Current weight loss supplements/medications: no Previous weight loss supplements/meds: no   Menopausal Symptoms: no  Depression Screen done today and results listed below:     08/27/2022    9:35 AM 02/23/2022    9:32 AM 01/19/2022    2:13 PM 07/21/2021    1:09 PM 12/29/2020    9:43 AM  Depression screen PHQ 2/9  Decreased Interest 0 0 0 0 0  Down, Depressed, Hopeless 0 0 0 0 0  PHQ - 2 Score 0 0 0 0 0  Altered sleeping 0 0 0 0   Tired, decreased energy 0 0 0 0   Change in appetite 0 0 0 0   Feeling bad or failure about yourself  0 0 0 0   Trouble concentrating 0 0 0 0   Moving slowly or fidgety/restless 0 0 0 0   Suicidal thoughts 0 0 0 0   PHQ-9 Score 0 0 0 0   Difficult doing work/chores Not difficult  at all  Not difficult at all      Past Medical History:  Past Medical History:  Diagnosis Date   Cold    getting over a cold/cough   Headache    Migraine    Patient denies medical problems     Surgical History:  Past Surgical History:  Procedure Laterality Date   TONSILLECTOMY AND ADENOIDECTOMY Bilateral 10/28/2015   Procedure: TONSILLECTOMY AND ADENOIDECTOMY;  Surgeon: 10/30/2015, MD;  Location: [facility];  Service: ENT;  Laterality: Bilateral;    Medications:  Current Outpatient Medications on File Prior to Visit  Medication Sig   butalbital-acetaminophen-caffeine (FIORICET) 50-325-40 MG tablet Take 1 tablet by mouth every 4 (four) hours as needed.   eletriptan (RELPAX) 40 MG tablet    SPRINTEC 28 0.25-35 MG-MCG tablet Take 1 tablet by mouth daily.   venlafaxine (EFFEXOR) 37.5 MG tablet Take 37.5 mg in the morning and 75mg  at night.   No current facility-administered medications on file prior to visit.    Allergies:  Allergies  Allergen Reactions   Tape Rash    Social History:  Social History   Socioeconomic History   Marital status: Single    Spouse name: Not on file   Number of children: 0   Years  of education: Not on file   Highest education level: Not on file  Occupational History   Not on file  Tobacco Use   Smoking status: Never   Smokeless tobacco: Never  Vaping Use   Vaping Use: Never used  Substance and Sexual Activity   Alcohol use: No   Drug use: No   Sexual activity: Not Currently  Other Topics Concern   Not on file  Social History Narrative   Not on file   Social Determinants of Health   Financial Resource Strain: Not on file  Food Insecurity: Not on file  Transportation Needs: Not on file  Physical Activity: Not on file  Stress: Not on file  Social Connections: Not on file  Intimate Partner Violence: Not on file   Social History   Tobacco Use  Smoking Status Never  Smokeless Tobacco Never   Social History    Substance and Sexual Activity  Alcohol Use No    Family History:  Family History  Problem Relation Age of Onset   Hypertension Mother    Heart disease Father    Anxiety disorder Brother    Uterine cancer Maternal Grandmother    Heart disease Maternal Grandfather    Multiple sclerosis Paternal Grandmother    Heart disease Paternal Grandfather     Past medical history, surgical history, medications, allergies, family history and social history reviewed with patient today and changes made to appropriate areas of the chart.   Review of Systems  Constitutional: Negative.   HENT:  Positive for ear pain. Negative for congestion, ear discharge, hearing loss, nosebleeds, sinus pain, sore throat and tinnitus.   Eyes: Negative.   Respiratory: Negative.  Negative for stridor.   Cardiovascular: Negative.   Gastrointestinal: Negative.   Genitourinary: Negative.   Musculoskeletal: Negative.   Skin: Negative.   Neurological: Negative.   Endo/Heme/Allergies: Negative.   Psychiatric/Behavioral: Negative.     All other ROS negative except what is listed above and in the HPI.      Objective:    BP 118/82   Pulse 68   Temp 97.8 F (36.6 C) (Oral)   Ht 5\' 9"  (1.753 Deborah)   Wt 262 lb 8 oz (119.1 kg)   LMP 08/25/2022   SpO2 99%   BMI 38.76 kg/Deborah   Wt Readings from Last 3 Encounters:  08/27/22 262 lb 8 oz (119.1 kg)  02/23/22 256 lb (116.1 kg)  01/19/22 249 lb 9.6 oz (113.2 kg)    Physical Exam Vitals and nursing note reviewed.  Constitutional:      General: She is not in acute distress.    Appearance: Normal appearance. She is not ill-appearing, toxic-appearing or diaphoretic.  HENT:     Head: Normocephalic and atraumatic.     Right Ear: Tympanic membrane, ear canal and external ear normal. There is no impacted cerumen.     Left Ear: Tympanic membrane, ear canal and external ear normal. There is no impacted cerumen.     Nose: Nose normal. No congestion or rhinorrhea.      Mouth/Throat:     Mouth: Mucous membranes are moist.     Pharynx: Oropharynx is clear. No oropharyngeal exudate or posterior oropharyngeal erythema.  Eyes:     General: No scleral icterus.       Right eye: No discharge.        Left eye: No discharge.     Extraocular Movements: Extraocular movements intact.     Conjunctiva/sclera: Conjunctivae normal.  Pupils: Pupils are equal, round, and reactive to light.  Neck:     Vascular: No carotid bruit.  Cardiovascular:     Rate and Rhythm: Normal rate and regular rhythm.     Pulses: Normal pulses.     Heart sounds: No murmur heard.    No friction rub. No gallop.  Pulmonary:     Effort: Pulmonary effort is normal. No respiratory distress.     Breath sounds: Normal breath sounds. No stridor. No wheezing, rhonchi or rales.  Chest:     Chest wall: No tenderness.  Abdominal:     General: Abdomen is flat. Bowel sounds are normal. There is no distension.     Palpations: Abdomen is soft. There is no mass.     Tenderness: There is no abdominal tenderness. There is no right CVA tenderness, left CVA tenderness, guarding or rebound.     Hernia: No hernia is present.  Genitourinary:    Comments: Breast and pelvic exams deferred with shared decision making Musculoskeletal:        General: No swelling, tenderness, deformity or signs of injury.     Cervical back: Normal range of motion and neck supple. No rigidity. No muscular tenderness.     Right lower leg: No edema.     Left lower leg: No edema.  Lymphadenopathy:     Cervical: No cervical adenopathy.  Skin:    General: Skin is warm and dry.     Capillary Refill: Capillary refill takes less than 2 seconds.     Coloration: Skin is not jaundiced or pale.     Findings: No bruising, erythema, lesion or rash.  Neurological:     General: No focal deficit present.     Mental Status: She is alert and oriented to person, place, and time. Mental status is at baseline.     Cranial Nerves: No cranial  nerve deficit.     Sensory: No sensory deficit.     Motor: No weakness.     Coordination: Coordination normal.     Gait: Gait normal.     Deep Tendon Reflexes: Reflexes normal.  Psychiatric:        Mood and Affect: Mood normal.        Behavior: Behavior normal.        Thought Content: Thought content normal.        Judgment: Judgment normal.     Results for orders placed or performed in visit on 07/21/21  GC/Chlamydia Probe Amp   Specimen: Urine   UR  Result Value Ref Range   Chlamydia trachomatis, NAA Negative Negative   Neisseria Gonorrhoeae by PCR Negative Negative  CBC with Differential/Platelet  Result Value Ref Range   WBC 5.5 3.4 - 10.8 x10E3/uL   RBC 4.99 3.77 - 5.28 x10E6/uL   Hemoglobin 15.0 11.1 - 15.9 g/dL   Hematocrit 95.6 38.7 - 46.6 %   MCV 90 79 - 97 fL   MCH 30.1 26.6 - 33.0 pg   MCHC 33.6 31.5 - 35.7 g/dL   RDW 56.4 33.2 - 95.1 %   Platelets 208 150 - 450 x10E3/uL   Neutrophils 53 Not Estab. %   Lymphs 37 Not Estab. %   Monocytes 5 Not Estab. %   Eos 4 Not Estab. %   Basos 1 Not Estab. %   Neutrophils Absolute 2.9 1.4 - 7.0 x10E3/uL   Lymphocytes Absolute 2.0 0.7 - 3.1 x10E3/uL   Monocytes Absolute 0.3 0.1 - 0.9 x10E3/uL   EOS (  ABSOLUTE) 0.2 0.0 - 0.4 x10E3/uL   Basophils Absolute 0.0 0.0 - 0.2 x10E3/uL   Immature Granulocytes 0 Not Estab. %   Immature Grans (Abs) 0.0 0.0 - 0.1 x10E3/uL  Comprehensive metabolic panel  Result Value Ref Range   Glucose 81 70 - 99 mg/dL   BUN 12 6 - 20 mg/dL   Creatinine, Ser 0.76 0.57 - 1.00 mg/dL   eGFR 115 >59 mL/min/1.73   BUN/Creatinine Ratio 16 9 - 23   Sodium 140 134 - 144 mmol/L   Potassium 4.0 3.5 - 5.2 mmol/L   Chloride 104 96 - 106 mmol/L   CO2 21 20 - 29 mmol/L   Calcium 9.4 8.7 - 10.2 mg/dL   Total Protein 7.3 6.0 - 8.5 g/dL   Albumin 4.5 3.9 - 5.0 g/dL   Globulin, Total 2.8 1.5 - 4.5 g/dL   Albumin/Globulin Ratio 1.6 1.2 - 2.2   Bilirubin Total 0.3 0.0 - 1.2 mg/dL   Alkaline Phosphatase 81 42 -  106 IU/L   AST 13 0 - 40 IU/L   ALT 13 0 - 32 IU/L  Lipid Panel w/o Chol/HDL Ratio  Result Value Ref Range   Cholesterol, Total 207 (H) 100 - 199 mg/dL   Triglycerides 170 (H) 0 - 149 mg/dL   HDL 63 >39 mg/dL   VLDL Cholesterol Cal 29 5 - 40 mg/dL   LDL Chol Calc (NIH) 115 (H) 0 - 99 mg/dL  Urinalysis, Routine w reflex microscopic  Result Value Ref Range   Specific Gravity, UA 1.021 1.005 - 1.030   pH, UA 6.0 5.0 - 7.5   Color, UA Yellow Yellow   Appearance Ur Clear Clear   Leukocytes,UA Negative Negative   Protein,UA Negative Negative/Trace   Glucose, UA Negative Negative   Ketones, UA Negative Negative   RBC, UA Negative Negative   Bilirubin, UA Negative Negative   Urobilinogen, Ur 0.2 0.2 - 1.0 mg/dL   Nitrite, UA Negative Negative   Microscopic Examination Comment   TSH  Result Value Ref Range   TSH 1.100 0.450 - 4.500 uIU/mL  HIV Antibody (routine testing w rflx)  Result Value Ref Range   HIV Screen 4th Generation wRfx Non Reactive Non Reactive  Hepatitis C Antibody  Result Value Ref Range   Hep C Virus Ab <0.1 0.0 - 0.9 s/co ratio      Assessment & Plan:   Problem List Items Addressed This Visit       Cardiovascular and Mediastinum   Migraine without aura    Under good control on current regimen. Continue current regimen. Continue to monitor. Call with any concerns. Refills given.        Relevant Medications   eletriptan (RELPAX) 40 MG tablet   Galcanezumab-gnlm (EMGALITY) 120 MG/ML SOSY     Other   BMI 38.0-38.9,adult    Would like to start wegovy. Discussed national back order. She is willing to try. Rx sent to her pharmacy. Recheck 3 months if she is able to start.       Other Visit Diagnoses     Routine general medical examination at a health care facility    -  Primary   Vaccines up to date. Pap through GYN. Screening labs checked today. Continue diet and exercise. Call with any concerns. Continue to monitor.   Relevant Orders   CBC with  Differential/Platelet   Comprehensive metabolic panel   Lipid Panel w/o Chol/HDL Ratio   Urinalysis, Routine w reflex microscopic   TSH  GC/Chlamydia Probe Amp        Follow up plan: Return in about 3 months (around 11/26/2022) for if able to start wegovy.   LABORATORY TESTING:  - Pap smear: up to date  IMMUNIZATIONS:   - Tdap: Tetanus vaccination status reviewed: last tetanus booster within 10 years. - Influenza: Up to date - Pneumovax: Not applicable - COVID: Up to date - HPV: Up to date   PATIENT COUNSELING:   Advised to take 1 mg of folate supplement per day if capable of pregnancy.   Sexuality: Discussed sexually transmitted diseases, partner selection, use of condoms, avoidance of unintended pregnancy  and contraceptive alternatives.   Advised to avoid cigarette smoking.  I discussed with the patient that most people either abstain from alcohol or drink within safe limits (<=14/week and <=4 drinks/occasion for males, <=7/weeks and <= 3 drinks/occasion for females) and that the risk for alcohol disorders and other health effects rises proportionally with the number of drinks per week and how often a drinker exceeds daily limits.  Discussed cessation/primary prevention of drug use and availability of treatment for abuse.   Diet: Encouraged to adjust caloric intake to maintain  or achieve ideal body weight, to reduce intake of dietary saturated fat and total fat, to limit sodium intake by avoiding high sodium foods and not adding table salt, and to maintain adequate dietary potassium and calcium preferably from fresh fruits, vegetables, and low-fat dairy products.    stressed the importance of regular exercise  Injury prevention: Discussed safety belts, safety helmets, smoke detector, smoking near bedding or upholstery.   Dental health: Discussed importance of regular tooth brushing, flossing, and dental visits.    NEXT PREVENTATIVE PHYSICAL DUE IN 1 YEAR. Return in  about 3 months (around 11/26/2022) for if able to start wegovy.

## 2022-08-27 NOTE — Assessment & Plan Note (Signed)
Would like to start wegovy. Discussed national back order. She is willing to try. Rx sent to her pharmacy. Recheck 3 months if she is able to start.

## 2022-08-28 LAB — CBC WITH DIFFERENTIAL/PLATELET
Basophils Absolute: 0.1 10*3/uL (ref 0.0–0.2)
Basos: 1 %
EOS (ABSOLUTE): 0.2 10*3/uL (ref 0.0–0.4)
Eos: 3 %
Hematocrit: 43.8 % (ref 34.0–46.6)
Hemoglobin: 14.3 g/dL (ref 11.1–15.9)
Immature Grans (Abs): 0 10*3/uL (ref 0.0–0.1)
Immature Granulocytes: 0 %
Lymphocytes Absolute: 3 10*3/uL (ref 0.7–3.1)
Lymphs: 52 %
MCH: 28.4 pg (ref 26.6–33.0)
MCHC: 32.6 g/dL (ref 31.5–35.7)
MCV: 87 fL (ref 79–97)
Monocytes Absolute: 0.4 10*3/uL (ref 0.1–0.9)
Monocytes: 7 %
Neutrophils Absolute: 2.2 10*3/uL (ref 1.4–7.0)
Neutrophils: 37 %
Platelets: 212 10*3/uL (ref 150–450)
RBC: 5.03 x10E6/uL (ref 3.77–5.28)
RDW: 12.8 % (ref 11.7–15.4)
WBC: 5.8 10*3/uL (ref 3.4–10.8)

## 2022-08-28 LAB — LIPID PANEL W/O CHOL/HDL RATIO
Cholesterol, Total: 231 mg/dL — ABNORMAL HIGH (ref 100–199)
HDL: 48 mg/dL (ref >39)
LDL Chol Calc (NIH): 117 mg/dL — ABNORMAL HIGH (ref 0–99)
Triglycerides: 379 mg/dL — ABNORMAL HIGH (ref 0–149)
VLDL Cholesterol Cal: 66 mg/dL — ABNORMAL HIGH (ref 5–40)

## 2022-08-28 LAB — COMPREHENSIVE METABOLIC PANEL
ALT: 17 IU/L (ref 0–32)
AST: 18 IU/L (ref 0–40)
Albumin/Globulin Ratio: 1.4 (ref 1.2–2.2)
Albumin: 4.1 g/dL (ref 4.0–5.0)
Alkaline Phosphatase: 91 IU/L (ref 44–121)
BUN/Creatinine Ratio: 11 (ref 9–23)
BUN: 9 mg/dL (ref 6–20)
Bilirubin Total: 0.3 mg/dL (ref 0.0–1.2)
CO2: 23 mmol/L (ref 20–29)
Calcium: 9.4 mg/dL (ref 8.7–10.2)
Chloride: 104 mmol/L (ref 96–106)
Creatinine, Ser: 0.79 mg/dL (ref 0.57–1.00)
Globulin, Total: 2.9 g/dL (ref 1.5–4.5)
Glucose: 86 mg/dL (ref 70–99)
Potassium: 4 mmol/L (ref 3.5–5.2)
Sodium: 141 mmol/L (ref 134–144)
Total Protein: 7 g/dL (ref 6.0–8.5)
eGFR: 109 mL/min/{1.73_m2} (ref >59)

## 2022-08-28 LAB — TSH: TSH: 1.22 u[IU]/mL (ref 0.450–4.500)

## 2022-08-29 LAB — GC/CHLAMYDIA PROBE AMP
Chlamydia trachomatis, NAA: NEGATIVE
Neisseria Gonorrhoeae by PCR: NEGATIVE

## 2022-11-27 ENCOUNTER — Telehealth: Payer: Self-pay | Admitting: Family Medicine

## 2022-11-27 NOTE — Telephone Encounter (Signed)
yes

## 2022-11-27 NOTE — Telephone Encounter (Signed)
Copied from CRM [id number]. Topic: General - Other >> Nov 27, 2022  8:07 AM Carrielelia G wrote: Reason for CRM: patient  last  tdap was in 2013. Patient would like to know is she needs to have another tdap

## 2022-11-27 NOTE — Telephone Encounter (Signed)
Patient was notified and made aware of Dr Henriette Combs recommendations. Advised patient to give our office a call or send our office a message if she has any questions or concerns regarding the MyChart message.

## 2022-11-29 ENCOUNTER — Ambulatory Visit (INDEPENDENT_AMBULATORY_CARE_PROVIDER_SITE_OTHER): Payer: BC Managed Care – PPO

## 2022-11-29 DIAGNOSIS — Z23 Encounter for immunization: Secondary | ICD-10-CM | POA: Diagnosis not present

## 2023-01-14 ENCOUNTER — Encounter: Payer: Self-pay | Admitting: Nurse Practitioner

## 2023-01-14 ENCOUNTER — Ambulatory Visit: Payer: Self-pay | Admitting: *Deleted

## 2023-01-14 ENCOUNTER — Ambulatory Visit: Payer: BC Managed Care – PPO | Admitting: Nurse Practitioner

## 2023-01-14 VITALS — BP 125/82 | HR 69 | Temp 98.2°F | Wt 262.0 lb

## 2023-01-14 DIAGNOSIS — B070 Plantar wart: Secondary | ICD-10-CM | POA: Diagnosis not present

## 2023-01-14 NOTE — Progress Notes (Unsigned)
BP 125/82   Pulse 69   Temp 98.2 F (36.8 C) (Oral)   Wt 262 lb (118.8 kg)   LMP 01/12/2023 (Exact Date)   SpO2 98%   BMI 38.69 kg/m    Subjective:    Patient ID: Deborah Summers, female    DOB: 2001/06/18, 22 y.o.   MRN: [id number]  HPI: Deborah Summers is a 22 y.o. female  Chief Complaint  Patient presents with   Toe Pain   TOE PAIN Duration: months Involved toe: leftbig toe  Mechanism of injury: unknown Onset: gradual Severity: 3/10  Quality: aching Frequency: intermittent Radiation: no Aggravating factors: walking  Alleviating factors: NSAIDs  Status: worse Treatments attempted: ibuprofen  Relief with NSAIDs?: mild Morning stiffness: no Redness: no  Bruising: no Swelling: no Paresthesias / decreased sensation: no Fevers: no    Relevant past medical, surgical, family and social history reviewed and updated as indicated. Interim medical history since our last visit reviewed. Allergies and medications reviewed and updated.  Review of Systems  Per HPI unless specifically indicated above     Objective:    BP 125/82   Pulse 69   Temp 98.2 F (36.8 C) (Oral)   Wt 262 lb (118.8 kg)   LMP 01/12/2023 (Exact Date)   SpO2 98%   BMI 38.69 kg/m   Wt Readings from Last 3 Encounters:  01/14/23 262 lb (118.8 kg)  08/27/22 262 lb 8 oz (119.1 kg)  02/23/22 256 lb (116.1 kg)    Physical Exam  Results for orders placed or performed in visit on 08/27/22  GC/Chlamydia Probe Amp   Specimen: Urine   UR  Result Value Ref Range   Chlamydia trachomatis, NAA Negative Negative   Neisseria Gonorrhoeae by PCR Negative Negative  Microscopic Examination   Urine  Result Value Ref Range   WBC, UA None seen 0 - 5 /hpf   RBC, Urine 0-2 0 - 2 /hpf   Epithelial Cells (non renal) 0-10 0 - 10 /hpf   Bacteria, UA None seen None seen/Few  CBC with Differential/Platelet  Result Value Ref Range   WBC 5.8 3.4 - 10.8 x10E3/uL   RBC 5.03 3.77 - 5.28 x10E6/uL   Hemoglobin  14.3 11.1 - 15.9 g/dL   Hematocrit 40.9 81.1 - 46.6 %   MCV 87 79 - 97 fL   MCH 28.4 26.6 - 33.0 pg   MCHC 32.6 31.5 - 35.7 g/dL   RDW 91.4 78.2 - 95.6 %   Platelets 212 150 - 450 x10E3/uL   Neutrophils 37 Not Estab. %   Lymphs 52 Not Estab. %   Monocytes 7 Not Estab. %   Eos 3 Not Estab. %   Basos 1 Not Estab. %   Neutrophils Absolute 2.2 1.4 - 7.0 x10E3/uL   Lymphocytes Absolute 3.0 0.7 - 3.1 x10E3/uL   Monocytes Absolute 0.4 0.1 - 0.9 x10E3/uL   EOS (ABSOLUTE) 0.2 0.0 - 0.4 x10E3/uL   Basophils Absolute 0.1 0.0 - 0.2 x10E3/uL   Immature Granulocytes 0 Not Estab. %   Immature Grans (Abs) 0.0 0.0 - 0.1 x10E3/uL  Comprehensive metabolic panel  Result Value Ref Range   Glucose 86 70 - 99 mg/dL   BUN 9 6 - 20 mg/dL   Creatinine, Ser 2.13 0.57 - 1.00 mg/dL   eGFR 086 >57 QI/ONG/2.95   BUN/Creatinine Ratio 11 9 - 23   Sodium 141 134 - 144 mmol/L   Potassium 4.0 3.5 - 5.2 mmol/L   Chloride 104  96 - 106 mmol/L   CO2 23 20 - 29 mmol/L   Calcium 9.4 8.7 - 10.2 mg/dL   Total Protein 7.0 6.0 - 8.5 g/dL   Albumin 4.1 4.0 - 5.0 g/dL   Globulin, Total 2.9 1.5 - 4.5 g/dL   Albumin/Globulin Ratio 1.4 1.2 - 2.2   Bilirubin Total 0.3 0.0 - 1.2 mg/dL   Alkaline Phosphatase 91 44 - 121 IU/L   AST 18 0 - 40 IU/L   ALT 17 0 - 32 IU/L  Lipid Panel w/o Chol/HDL Ratio  Result Value Ref Range   Cholesterol, Total 231 (H) 100 - 199 mg/dL   Triglycerides 527 (H) 0 - 149 mg/dL   HDL 48 >78 mg/dL   VLDL Cholesterol Cal 66 (H) 5 - 40 mg/dL   LDL Chol Calc (NIH) 242 (H) 0 - 99 mg/dL  Urinalysis, Routine w reflex microscopic  Result Value Ref Range   Specific Gravity, UA >1.030 (H) 1.005 - 1.030   pH, UA 5.0 5.0 - 7.5   Color, UA Yellow Yellow   Appearance Ur Clear Clear   Leukocytes,UA Negative Negative   Protein,UA Negative Negative/Trace   Glucose, UA Negative Negative   Ketones, UA Negative Negative   RBC, UA 2+ (A) Negative   Bilirubin, UA Negative Negative   Urobilinogen, Ur 0.2 0.2 -  1.0 mg/dL   Nitrite, UA Negative Negative   Microscopic Examination See below:   TSH  Result Value Ref Range   TSH 1.220 0.450 - 4.500 uIU/mL  HM PAP SMEAR  Result Value Ref Range   HM Pap smear See Attached Report in Chart       Assessment & Plan:   Problem List Items Addressed This Visit   None    Follow up plan: No follow-ups on file.

## 2023-01-14 NOTE — Telephone Encounter (Signed)
  Chief Complaint: lump on toe, toe numbness Symptoms: left foot- painful lump on great toe, numbness in 2-3 toes Frequency: at least 2 weeks Pertinent Negatives: Patient denies leg pain, rash, fever  Disposition: [] ED /[] Urgent Care (no appt availability in office) / [x] Appointment(In office/virtual)/ []  [place]/ [] Home Care/ [] Refused Recommended Disposition /[] [place] [facility]/ []  Follow-up with PCP Additional Notes: Appointment has been scheduled for evaluation of foot

## 2023-01-14 NOTE — Telephone Encounter (Signed)
Reason for Disposition  Numbness in one foot (i.e., loss of sensation)  Answer Assessment - Initial Assessment Questions 1. ONSET: "When did the pain start?"      Pain in toe- 2 weeks 2. LOCATION: "Where is the pain located?"   (e.g., around nail, entire toe, at foot joint)      Left great toe, numbness in left foot- 2-3 toe 3. PAIN: "How bad is the pain?"    (Scale 1-10; or mild, moderate, severe)   -  MILD (1-3): doesn't interfere with normal activities    -  MODERATE (4-7): interferes with normal activities (e.g., work or school) or awakens from sleep, limping    -  SEVERE (8-10): excruciating pain, unable to do any normal activities, unable to walk     mild 4. APPEARANCE: "What does the toe look like?" (e.g., redness, swelling, bruising, pallor)     Hard-calis like- black spots in it 5. CAUSE: "What do you think is causing the toe pain?"     Unsure what is causing numbness- 1 month 6. OTHER SYMPTOMS: "Do you have any other symptoms?" (e.g., leg pain, rash, fever, numbness)     no  Protocols used: Toe Pain-A-AH

## 2023-02-06 ENCOUNTER — Ambulatory Visit: Payer: BC Managed Care – PPO | Admitting: Podiatry

## 2023-02-06 ENCOUNTER — Encounter: Payer: Self-pay | Admitting: Podiatry

## 2023-02-06 DIAGNOSIS — B070 Plantar wart: Secondary | ICD-10-CM | POA: Diagnosis not present

## 2023-02-06 MED ORDER — FLUOROURACIL 5 % EX CREA: TOPICAL_CREAM | Freq: Two times a day (BID) | CUTANEOUS | 1 refills | Status: DC

## 2023-02-06 NOTE — Progress Notes (Signed)
Subjective:  Patient ID: Deborah Summers, female    DOB: 2001-07-03,  MRN: [id number] HPI Chief Complaint  Patient presents with   Skin Problem    Plantar hallus left - callused lesion x 3-4 months, PCP eval and referred here to treat for a wart   Foot Pain    Dorsal forefoot/2nd and 3rd toes left - some tingling and numbness   New Patient (Initial Visit)    22 y.o. female presents with the above complaint.   ROS: Denies fever chills nausea vomit muscle aches pains calf pain back pain chest pain shortness of breath.  Past Medical History:  Diagnosis Date   Cold    getting over a cold/cough   Headache    Migraine    Patient denies medical problems    Past Surgical History:  Procedure Laterality Date   TONSILLECTOMY AND ADENOIDECTOMY Bilateral 10/28/2015   Procedure: TONSILLECTOMY AND ADENOIDECTOMY;  Surgeon: Linus Salmons, MD;  Location: [facility];  Service: ENT;  Laterality: Bilateral;    Current Outpatient Medications:    fluorouracil (EFUDEX) 5 % cream, Apply topically 2 (two) times daily., Disp: 40 g, Rfl: 1   eletriptan (RELPAX) 40 MG tablet, , Disp: , Rfl:    escitalopram (LEXAPRO) 10 MG tablet, PLEASE SEE ATTACHED FOR DETAILED DIRECTIONS, Disp: , Rfl:    Galcanezumab-gnlm (EMGALITY) 120 MG/ML SOSY, Inject 120 mg into the skin every 30 (thirty) days., Disp: 1.12 mL, Rfl: 6   SPRINTEC 28 0.25-35 MG-MCG tablet, Take 1 tablet by mouth daily., Disp: 84 tablet, Rfl: 3  Allergies  Allergen Reactions   Tape Rash   Review of Systems Objective:  There were no vitals filed for this visit.  General: Well developed, nourished, in no acute distress, alert and oriented x3   Dermatological: Skin is warm, dry and supple bilateral. Nails x 10 are well maintained; remaining integument appears unremarkable at this time. There are no open sores, no preulcerative lesions, no rash or signs of infection present.  A verrucoid lesion with thrombosed capillaries to the plantar  medial aspect of the tuft of the hallux left.  There are 2 small satellite lesions the primary lesion measures less than 3 mm in diameter.  Skin lines circumvent the lesion and again thrombosed capillaries are visible.  Once this was debrided it was almost completely resolved.  Vascular: Dorsalis Pedis artery and Posterior Tibial artery pedal pulses are 2/4 bilateral with immedate capillary fill time. Pedal hair growth present. No varicosities and no lower extremity edema present bilateral.   Neruologic: Grossly intact via light touch bilateral. Vibratory intact via tuning fork bilateral. Protective threshold with Semmes Wienstein monofilament intact to all pedal sites bilateral. Patellar and Achilles deep tendon reflexes 2+ bilateral. No Babinski or clonus noted bilateral.  Allodynia type symptomatology to the dorsal aspect of the left foot with stroking of the deep peroneal nerve and the medial dorsal cutaneous nerve as well as the intermediate dorsal cutaneous nerve.  This starts with all of these nerves around the second and third tarsometatarsal joints.  Musculoskeletal: No gross boney pedal deformities bilateral. No pain, crepitus, or limitation noted with foot and ankle range of motion bilateral. Muscular strength 5/5 in all groups tested bilateral.  Gait: Unassisted, Nonantalgic.    Radiographs:  None taken  Assessment & Plan:   Assessment: Verruca plantaris plantar aspect hallux left neuritis allodynia less likely compression in origin.  Plan: Discussed etiology pathology conservative surgical therapies at this point I am going to go  ahead debride this and place Cantharone under occlusion to be washed off thoroughly tomorrow she was started Efudex cream.       Orlyn Odonoghue T. [place], [place]

## 2023-02-07 ENCOUNTER — Other Ambulatory Visit: Payer: Self-pay | Admitting: Family Medicine

## 2023-02-08 NOTE — Telephone Encounter (Signed)
Requested Prescriptions  Pending Prescriptions Disp Refills   norgestimate-ethinyl estradiol (ORTHO-CYCLEN) 0.25-35 MG-MCG tablet [Pharmacy Med Name: NORG-ETHIN ESTRA 0.25-0.035 MG] 84 tablet 0    Sig: TAKE 1 TABLET BY MOUTH EVERY DAY     OB/GYN:  Contraceptives Passed - 02/07/2023  2:40 AM      Passed - Last BP in normal range    BP Readings from Last 1 Encounters:  01/14/23 125/82         Passed - Valid encounter within last 12 months    Recent Outpatient Visits           3 weeks ago Plantar wart   [place] [facility] Larae Grooms, NP   5 months ago Routine general medical examination at a health care facility   [facility], Megan P, DO   11 months ago Migraine without aura and without status migrainosus, not intractable   [place] [facility] [place], Megan P, DO   1 year ago Migraine without aura and without status migrainosus, not intractable   [facility] [place], Megan P, DO   1 year ago Routine general medical examination at a health care facility   [facility] Dorcas Carrow, DO       Future Appointments             In 3 weeks Dorcas Carrow, DO [facility]   In 6 months Laural Benes, Oralia Rud, DO [place] [facility]            Passed - Patient is not a smoker

## 2023-03-01 ENCOUNTER — Ambulatory Visit: Payer: BC Managed Care – PPO | Admitting: Family Medicine

## 2023-03-27 ENCOUNTER — Ambulatory Visit: Payer: BC Managed Care – PPO | Admitting: Podiatry

## 2023-04-23 ENCOUNTER — Ambulatory Visit: Payer: BC Managed Care – PPO | Admitting: Family Medicine

## 2023-04-23 VITALS — BP 120/80 | HR 84 | Temp 98.4°F | Ht 68.9 in | Wt 266.6 lb

## 2023-04-23 DIAGNOSIS — H60503 Unspecified acute noninfective otitis externa, bilateral: Secondary | ICD-10-CM

## 2023-04-23 MED ORDER — AMOXICILLIN-POT CLAVULANATE 875-125 MG PO TABS: 1.0000 | ORAL_TABLET | Freq: Two times a day (BID) | ORAL | 0 refills | Status: DC

## 2023-04-23 MED ORDER — AMOXICILLIN-POT CLAVULANATE 875-125 MG PO TABS: 1.0000 | ORAL_TABLET | Freq: Two times a day (BID) | ORAL | 0 refills | Status: AC

## 2023-04-23 MED ORDER — CIPROFLOXACIN-DEXAMETHASONE 0.3-0.1 % OT SUSP: 4.0000 [drp] | Freq: Two times a day (BID) | OTIC | 0 refills | Status: DC

## 2023-04-23 NOTE — Progress Notes (Signed)
BP 120/80   Pulse 84   Temp 98.4 F (36.9 C) (Oral)   Ht 5' 8.9" (1.75 m)   Wt 266 lb 9.6 oz (120.9 kg)   SpO2 97%   BMI 39.49 kg/m    Subjective:    Patient ID: Deborah Summers, female    DOB: 27-Aug-2000, 22 y.o.   MRN: [id number]  HPI: Deborah Summers is a 22 y.o. female  Chief Complaint  Patient presents with   Ear Pain    Right ear pain   EAR PAIN Has worsened since Friday  Duration: 4 days Involved ear(s): right Severity:  7/10  Quality:  aching, throbbing, and pressure Fever: no Otorrhea: no Upper respiratory infection symptoms: no Pruritus: no Hearing loss: no Water immersion no Using Q-tips: yes Recurrent otitis media: no Status: worse Treatments attempted: Hot and cold compress, with hot water steam, that she admits did not help.   Relevant past medical, surgical, family and social history reviewed and updated as indicated. Interim medical history since our last visit reviewed. Allergies and medications reviewed and updated.  Review of Systems  HENT:  Positive for ear pain (w itchiness). Negative for ear discharge and hearing loss.   Respiratory: Negative.    Cardiovascular: Negative.    Per HPI unless specifically indicated above     Objective:    BP 120/80   Pulse 84   Temp 98.4 F (36.9 C) (Oral)   Ht 5' 8.9" (1.75 m)   Wt 266 lb 9.6 oz (120.9 kg)   SpO2 97%   BMI 39.49 kg/m   Wt Readings from Last 3 Encounters:  04/23/23 266 lb 9.6 oz (120.9 kg)  01/14/23 262 lb (118.8 kg)  08/27/22 262 lb 8 oz (119.1 kg)    Physical Exam Vitals and nursing note reviewed.  Constitutional:      General: She is awake. She is not in acute distress.    Appearance: Normal appearance. She is well-developed and well-groomed. She is not ill-appearing.  HENT:     Head: Normocephalic and atraumatic.     Right Ear: Hearing normal. Tenderness present. No drainage.     Left Ear: Hearing normal. No drainage.     Ears:     Comments: Dermatitis in bilateral ear  canals    Nose: Nose normal.     Mouth/Throat:     Comments: Right jaw tenderness Eyes:     General: Lids are normal.        Right eye: No discharge.        Left eye: No discharge.     Conjunctiva/sclera: Conjunctivae normal.  Cardiovascular:     Rate and Rhythm: Normal rate and regular rhythm.     Pulses:          Radial pulses are 2+ on the right side and 2+ on the left side.       Posterior tibial pulses are 2+ on the right side and 2+ on the left side.     Heart sounds: Normal heart sounds, S1 normal and S2 normal. No murmur heard.    No gallop.  Pulmonary:     Effort: Pulmonary effort is normal. No accessory muscle usage or respiratory distress.     Breath sounds: Normal breath sounds.  Musculoskeletal:        General: Normal range of motion.     Cervical back: Full passive range of motion without pain and normal range of motion.     Right lower leg:  No edema.     Left lower leg: No edema.  Skin:    General: Skin is warm and dry.     Capillary Refill: Capillary refill takes less than 2 seconds.  Neurological:     Mental Status: She is alert and oriented to person, place, and time.  Psychiatric:        Attention and Perception: Attention normal.        Mood and Affect: Mood normal.        Speech: Speech normal.        Behavior: Behavior normal. Behavior is cooperative.        Thought Content: Thought content normal.     Results for orders placed or performed in visit on 08/27/22  GC/Chlamydia Probe Amp   Specimen: Urine   UR  Result Value Ref Range   Chlamydia trachomatis, NAA Negative Negative   Neisseria Gonorrhoeae by PCR Negative Negative  Microscopic Examination   Urine  Result Value Ref Range   WBC, UA None seen 0 - 5 /hpf   RBC, Urine 0-2 0 - 2 /hpf   Epithelial Cells (non renal) 0-10 0 - 10 /hpf   Bacteria, UA None seen None seen/Few  CBC with Differential/Platelet  Result Value Ref Range   WBC 5.8 3.4 - 10.8 x10E3/uL   RBC 5.03 3.77 - 5.28 x10E6/uL    Hemoglobin 14.3 11.1 - 15.9 g/dL   Hematocrit 42.5 95.6 - 46.6 %   MCV 87 79 - 97 fL   MCH 28.4 26.6 - 33.0 pg   MCHC 32.6 31.5 - 35.7 g/dL   RDW 38.7 56.4 - 33.2 %   Platelets 212 150 - 450 x10E3/uL   Neutrophils 37 Not Estab. %   Lymphs 52 Not Estab. %   Monocytes 7 Not Estab. %   Eos 3 Not Estab. %   Basos 1 Not Estab. %   Neutrophils Absolute 2.2 1.4 - 7.0 x10E3/uL   Lymphocytes Absolute 3.0 0.7 - 3.1 x10E3/uL   Monocytes Absolute 0.4 0.1 - 0.9 x10E3/uL   EOS (ABSOLUTE) 0.2 0.0 - 0.4 x10E3/uL   Basophils Absolute 0.1 0.0 - 0.2 x10E3/uL   Immature Granulocytes 0 Not Estab. %   Immature Grans (Abs) 0.0 0.0 - 0.1 x10E3/uL  Comprehensive metabolic panel  Result Value Ref Range   Glucose 86 70 - 99 mg/dL   BUN 9 6 - 20 mg/dL   Creatinine, Ser 9.51 0.57 - 1.00 mg/dL   eGFR 884 >16 SA/YTK/1.60   BUN/Creatinine Ratio 11 9 - 23   Sodium 141 134 - 144 mmol/L   Potassium 4.0 3.5 - 5.2 mmol/L   Chloride 104 96 - 106 mmol/L   CO2 23 20 - 29 mmol/L   Calcium 9.4 8.7 - 10.2 mg/dL   Total Protein 7.0 6.0 - 8.5 g/dL   Albumin 4.1 4.0 - 5.0 g/dL   Globulin, Total 2.9 1.5 - 4.5 g/dL   Albumin/Globulin Ratio 1.4 1.2 - 2.2   Bilirubin Total 0.3 0.0 - 1.2 mg/dL   Alkaline Phosphatase 91 44 - 121 IU/L   AST 18 0 - 40 IU/L   ALT 17 0 - 32 IU/L  Lipid Panel w/o Chol/HDL Ratio  Result Value Ref Range   Cholesterol, Total 231 (H) 100 - 199 mg/dL   Triglycerides 109 (H) 0 - 149 mg/dL   HDL 48 >32 mg/dL   VLDL Cholesterol Cal 66 (H) 5 - 40 mg/dL   LDL Chol Calc (NIH) 355 (H) 0 -  99 mg/dL  Urinalysis, Routine w reflex microscopic  Result Value Ref Range   Specific Gravity, UA >1.030 (H) 1.005 - 1.030   pH, UA 5.0 5.0 - 7.5   Color, UA Yellow Yellow   Appearance Ur Clear Clear   Leukocytes,UA Negative Negative   Protein,UA Negative Negative/Trace   Glucose, UA Negative Negative   Ketones, UA Negative Negative   RBC, UA 2+ (A) Negative   Bilirubin, UA Negative Negative    Urobilinogen, Ur 0.2 0.2 - 1.0 mg/dL   Nitrite, UA Negative Negative   Microscopic Examination See below:   TSH  Result Value Ref Range   TSH 1.220 0.450 - 4.500 uIU/mL  HM PAP SMEAR  Result Value Ref Range   HM Pap smear See Attached Report in Chart       Assessment & Plan:   Problem List Items Addressed This Visit   None Visit Diagnoses     Acute otitis externa of both ears, unspecified type    -  Primary   Acute, stable. Augmentin BID given for 7 days along with Ciprodex ear gtt. Return as needed. Call sooner if concerns arise.        Follow up plan: Return if symptoms worsen or fail to improve.

## 2023-04-26 ENCOUNTER — Ambulatory Visit: Payer: BC Managed Care – PPO | Admitting: Family Medicine

## 2023-04-30 ENCOUNTER — Other Ambulatory Visit: Payer: Self-pay | Admitting: Family Medicine

## 2023-04-30 NOTE — Telephone Encounter (Signed)
Requested Prescriptions  Pending Prescriptions Disp Refills   norgestimate-ethinyl estradiol (ORTHO-CYCLEN) 0.25-35 MG-MCG tablet [Pharmacy Med Name: NORG-ETHIN ESTRA 0.25-0.035 MG] 84 tablet 1    Sig: TAKE 1 TABLET BY MOUTH EVERY DAY     OB/GYN:  Contraceptives Passed - 04/30/2023  1:45 AM      Passed - Last BP in normal range    BP Readings from Last 1 Encounters:  04/23/23 120/80         Passed - Valid encounter within last 12 months    Recent Outpatient Visits           1 week ago Acute otitis externa of both ears, unspecified type   [place] [facility] [place], Sherran Needs, NP   3 months ago Plantar wart   [place] [facility] Larae Grooms, NP   8 months ago Routine general medical examination at a health care facility   [facility], [place] P, DO   1 year ago Migraine without aura and without status migrainosus, not intractable   [place] [facility] [place], Megan P, DO   1 year ago Migraine without aura and without status migrainosus, not intractable   [place] [facility] [place], Oralia Rud, DO       Future Appointments             In 4 months Laural Benes, Oralia Rud, DO [place] [facility]            Passed - Patient is not a smoker

## 2023-06-05 ENCOUNTER — Encounter: Payer: Self-pay | Admitting: Family Medicine

## 2023-06-05 NOTE — Telephone Encounter (Signed)
Called and schedule appointment with provider on 06/07/2023 @ 4:00 pm.

## 2023-06-07 ENCOUNTER — Encounter: Payer: Self-pay | Admitting: Family Medicine

## 2023-06-07 ENCOUNTER — Ambulatory Visit: Payer: BC Managed Care – PPO | Admitting: Family Medicine

## 2023-06-07 VITALS — BP 115/78 | HR 93 | Ht 68.0 in | Wt 264.4 lb

## 2023-06-07 DIAGNOSIS — H60503 Unspecified acute noninfective otitis externa, bilateral: Secondary | ICD-10-CM | POA: Diagnosis not present

## 2023-06-07 MED ORDER — CIPROFLOXACIN-DEXAMETHASONE 0.3-0.1 % OT SUSP: 4.0000 [drp] | Freq: Two times a day (BID) | OTIC | 0 refills | Status: AC

## 2023-06-07 NOTE — Progress Notes (Signed)
BP 115/78   Pulse 93   Ht 5\' 8"  (1.727 m)   Wt 264 lb 6.4 oz (119.9 kg)   SpO2 98%   BMI 40.20 kg/m    Subjective:    Patient ID: Deborah M Sarr, female    DOB: 02-03-2001, 22 y.o.   MRN: [id number]  HPI: Deborah Summers is a 22 y.o. female  Chief Complaint  Patient presents with   Ear Pain    Patient says she has noticed ear pain in both of her ears. Patient says she notices a constant pain in both of her ears and it radiates down into her jaw bone. Patient says she has tried Ibuprofen and Tylenol and says she get little to no relief. Patient says the area s tender to the touch.    EAR PAIN Duration: about a week Involved ear(s): bilateral Severity:  moderate  Quality:  aching Fever: no Otorrhea: yes Upper respiratory infection symptoms: no Pruritus: yes Hearing loss: no Water immersion no Using Q-tips: no Recurrent otitis media: no Status: worse Treatments attempted: none  Relevant past medical, surgical, family and social history reviewed and updated as indicated. Interim medical history since our last visit reviewed. Allergies and medications reviewed and updated.  Review of Systems  Constitutional: Negative.   HENT:  Positive for ear discharge. Negative for congestion, dental problem, drooling, ear pain, facial swelling, hearing loss, mouth sores, nosebleeds, postnasal drip, rhinorrhea, sinus pressure, sinus pain, sneezing, sore throat, tinnitus, trouble swallowing and voice change.   Respiratory: Negative.    Cardiovascular: Negative.   Musculoskeletal: Negative.   Psychiatric/Behavioral: Negative.      Per HPI unless specifically indicated above     Objective:    BP 115/78   Pulse 93   Ht 5\' 8"  (1.727 m)   Wt 264 lb 6.4 oz (119.9 kg)   SpO2 98%   BMI 40.20 kg/m   Wt Readings from Last 3 Encounters:  06/07/23 264 lb 6.4 oz (119.9 kg)  04/23/23 266 lb 9.6 oz (120.9 kg)  01/14/23 262 lb (118.8 kg)    Physical Exam Vitals and nursing note  reviewed.  Constitutional:      General: She is not in acute distress.    Appearance: Normal appearance. She is not ill-appearing, toxic-appearing or diaphoretic.  HENT:     Head: Normocephalic and atraumatic.     Right Ear: Tympanic membrane and external ear normal.     Left Ear: Tympanic membrane and external ear normal.     Ears:     Comments: Pus and swelling in both EAC    Nose: Nose normal. No congestion or rhinorrhea.     Mouth/Throat:     Mouth: Mucous membranes are moist.     Pharynx: Oropharynx is clear. No oropharyngeal exudate or posterior oropharyngeal erythema.  Eyes:     General: No scleral icterus.       Right eye: No discharge.        Left eye: No discharge.     Extraocular Movements: Extraocular movements intact.     Conjunctiva/sclera: Conjunctivae normal.     Pupils: Pupils are equal, round, and reactive to light.  Cardiovascular:     Rate and Rhythm: Normal rate and regular rhythm.     Pulses: Normal pulses.     Heart sounds: Normal heart sounds. No murmur heard.    No friction rub. No gallop.  Pulmonary:     Effort: Pulmonary effort is normal. No respiratory distress.  Breath sounds: Normal breath sounds. No stridor. No wheezing, rhonchi or rales.  Chest:     Chest wall: No tenderness.  Musculoskeletal:        General: Normal range of motion.     Cervical back: Normal range of motion and neck supple.  Skin:    General: Skin is warm and dry.     Capillary Refill: Capillary refill takes less than 2 seconds.     Coloration: Skin is not jaundiced or pale.     Findings: No bruising, erythema, lesion or rash.  Neurological:     General: No focal deficit present.     Mental Status: She is alert and oriented to person, place, and time. Mental status is at baseline.  Psychiatric:        Mood and Affect: Mood normal.        Behavior: Behavior normal.        Thought Content: Thought content normal.        Judgment: Judgment normal.     Results for  orders placed or performed in visit on 08/27/22  GC/Chlamydia Probe Amp   Specimen: Urine   UR  Result Value Ref Range   Chlamydia trachomatis, NAA Negative Negative   Neisseria Gonorrhoeae by PCR Negative Negative  Microscopic Examination   Urine  Result Value Ref Range   WBC, UA None seen 0 - 5 /hpf   RBC, Urine 0-2 0 - 2 /hpf   Epithelial Cells (non renal) 0-10 0 - 10 /hpf   Bacteria, UA None seen None seen/Few  CBC with Differential/Platelet  Result Value Ref Range   WBC 5.8 3.4 - 10.8 x10E3/uL   RBC 5.03 3.77 - 5.28 x10E6/uL   Hemoglobin 14.3 11.1 - 15.9 g/dL   Hematocrit 16.1 09.6 - 46.6 %   MCV 87 79 - 97 fL   MCH 28.4 26.6 - 33.0 pg   MCHC 32.6 31.5 - 35.7 g/dL   RDW 04.5 40.9 - 81.1 %   Platelets 212 150 - 450 x10E3/uL   Neutrophils 37 Not Estab. %   Lymphs 52 Not Estab. %   Monocytes 7 Not Estab. %   Eos 3 Not Estab. %   Basos 1 Not Estab. %   Neutrophils Absolute 2.2 1.4 - 7.0 x10E3/uL   Lymphocytes Absolute 3.0 0.7 - 3.1 x10E3/uL   Monocytes Absolute 0.4 0.1 - 0.9 x10E3/uL   EOS (ABSOLUTE) 0.2 0.0 - 0.4 x10E3/uL   Basophils Absolute 0.1 0.0 - 0.2 x10E3/uL   Immature Granulocytes 0 Not Estab. %   Immature Grans (Abs) 0.0 0.0 - 0.1 x10E3/uL  Comprehensive metabolic panel  Result Value Ref Range   Glucose 86 70 - 99 mg/dL   BUN 9 6 - 20 mg/dL   Creatinine, Ser 9.14 0.57 - 1.00 mg/dL   eGFR 782 >95 AO/ZHY/8.65   BUN/Creatinine Ratio 11 9 - 23   Sodium 141 134 - 144 mmol/L   Potassium 4.0 3.5 - 5.2 mmol/L   Chloride 104 96 - 106 mmol/L   CO2 23 20 - 29 mmol/L   Calcium 9.4 8.7 - 10.2 mg/dL   Total Protein 7.0 6.0 - 8.5 g/dL   Albumin 4.1 4.0 - 5.0 g/dL   Globulin, Total 2.9 1.5 - 4.5 g/dL   Albumin/Globulin Ratio 1.4 1.2 - 2.2   Bilirubin Total 0.3 0.0 - 1.2 mg/dL   Alkaline Phosphatase 91 44 - 121 IU/L   AST 18 0 - 40 IU/L   ALT 17 0 -  32 IU/L  Lipid Panel w/o Chol/HDL Ratio  Result Value Ref Range   Cholesterol, Total 231 (H) 100 - 199 mg/dL    Triglycerides 272 (H) 0 - 149 mg/dL   HDL 48 >53 mg/dL   VLDL Cholesterol Cal 66 (H) 5 - 40 mg/dL   LDL Chol Calc (NIH) 664 (H) 0 - 99 mg/dL  Urinalysis, Routine w reflex microscopic  Result Value Ref Range   Specific Gravity, UA >1.030 (H) 1.005 - 1.030   pH, UA 5.0 5.0 - 7.5   Color, UA Yellow Yellow   Appearance Ur Clear Clear   Leukocytes,UA Negative Negative   Protein,UA Negative Negative/Trace   Glucose, UA Negative Negative   Ketones, UA Negative Negative   RBC, UA 2+ (A) Negative   Bilirubin, UA Negative Negative   Urobilinogen, Ur 0.2 0.2 - 1.0 mg/dL   Nitrite, UA Negative Negative   Microscopic Examination See below:   TSH  Result Value Ref Range   TSH 1.220 0.450 - 4.500 uIU/mL  HM PAP SMEAR  Result Value Ref Range   HM Pap smear See Attached Report in Chart       Assessment & Plan:   Problem List Items Addressed This Visit   None Visit Diagnoses     Acute otitis externa of both ears, unspecified type    -  Primary   Will treat with ciprodex. Start mineral oil weekly in her ears. Call with any concerns or if not getting better. Continue to monitor.        Follow up plan: Return if symptoms worsen or fail to improve.

## 2023-07-03 ENCOUNTER — Encounter: Payer: Self-pay | Admitting: Family Medicine

## 2023-07-03 NOTE — Telephone Encounter (Signed)
Unfortunately we would need to see her. Can we see if any of the other offices have openings today?

## 2023-07-09 ENCOUNTER — Ambulatory Visit: Payer: BC Managed Care – PPO | Admitting: Family Medicine

## 2023-07-09 VITALS — BP 124/83 | HR 84 | Temp 97.9°F | Ht 70.47 in | Wt 268.2 lb

## 2023-07-09 DIAGNOSIS — H6506 Acute serous otitis media, recurrent, bilateral: Secondary | ICD-10-CM

## 2023-07-09 MED ORDER — AMOXICILLIN-POT CLAVULANATE 875-125 MG PO TABS: 1.0000 | ORAL_TABLET | Freq: Two times a day (BID) | ORAL | 0 refills | Status: AC

## 2023-07-09 NOTE — Assessment & Plan Note (Addendum)
Acute, ongoing. Urgent referral placed for ENT as this is x3 occurrence of the year. Will treat with Augmentin BID for 7 days. Recommend use of Zyrtec daily to help with ear fullness. Return if symptoms fail to improve if unable to schedule with ENT.

## 2023-07-09 NOTE — Progress Notes (Signed)
BP 124/83   Pulse 84   Temp 97.9 F (36.6 C) (Oral)   Ht 5' 10.47" (1.79 m)   Wt 268 lb 3.2 oz (121.7 kg)   SpO2 97%   BMI 37.97 kg/m    Subjective:    Patient ID: Deborah M Lunz, female    DOB: 11/07/2000, 22 y.o.   MRN: [id number]  HPI: Deborah Summers is a 22 y.o. female  Chief Complaint  Patient presents with   Ear Pain   Patient has had an ongoing issue with ear infections over the past two months. She was treated last month with Ciprodex ear drops with improvement for two weeks. She was treated with Augmentin 2 months ago. The ear pain has now returned and has been ongoing now for 1.5 weeks.   EAR PAIN Duration: 1.5 weeks Involved ear(s): bilateral Severity:  8/10  Quality:  throbbing Fever: no Otorrhea: yes bilateral; pus and blood  Upper respiratory infection symptoms: no Pruritus: no Hearing loss: no Water immersion no Using Q-tips: no Recurrent otitis media: yes Status: stable Treatments attempted:  Ciprodex and oil in ears    Relevant past medical, surgical, family and social history reviewed and updated as indicated. Interim medical history since our last visit reviewed. Allergies and medications reviewed and updated.  Review of Systems  Constitutional:  Negative for fever.  HENT:  Positive for ear discharge and ear pain.   Respiratory: Negative.    Cardiovascular: Negative.     Per HPI unless specifically indicated above     Objective:    BP 124/83   Pulse 84   Temp 97.9 F (36.6 C) (Oral)   Ht 5' 10.47" (1.79 m)   Wt 268 lb 3.2 oz (121.7 kg)   SpO2 97%   BMI 37.97 kg/m   Wt Readings from Last 3 Encounters:  07/09/23 268 lb 3.2 oz (121.7 kg)  06/07/23 264 lb 6.4 oz (119.9 kg)  04/23/23 266 lb 9.6 oz (120.9 kg)    Physical Exam Vitals and nursing note reviewed.  Constitutional:      General: She is awake. She is not in acute distress.    Appearance: Normal appearance. She is well-developed and well-groomed. She is not ill-appearing.   HENT:     Head: Normocephalic and atraumatic.     Right Ear: Hearing and external ear normal. Drainage and tenderness present. Tympanic membrane is erythematous.     Left Ear: Hearing and external ear normal. Drainage and tenderness present. Tympanic membrane is erythematous.     Nose: Nose normal.  Eyes:     General: Lids are normal.        Right eye: No discharge.        Left eye: No discharge.     Conjunctiva/sclera: Conjunctivae normal.  Cardiovascular:     Rate and Rhythm: Normal rate and regular rhythm.     Pulses:          Radial pulses are 2+ on the right side and 2+ on the left side.       Posterior tibial pulses are 2+ on the right side and 2+ on the left side.     Heart sounds: Normal heart sounds, S1 normal and S2 normal. No murmur heard.    No gallop.  Pulmonary:     Effort: Pulmonary effort is normal. No accessory muscle usage or respiratory distress.     Breath sounds: Normal breath sounds.  Musculoskeletal:        General:  Normal range of motion.     Cervical back: Full passive range of motion without pain and normal range of motion.     Right lower leg: No edema.     Left lower leg: No edema.  Lymphadenopathy:     Cervical: Cervical adenopathy present.  Skin:    General: Skin is warm and dry.     Capillary Refill: Capillary refill takes less than 2 seconds.  Neurological:     Mental Status: She is alert and oriented to person, place, and time.  Psychiatric:        Attention and Perception: Attention normal.        Mood and Affect: Mood normal.        Speech: Speech normal.        Behavior: Behavior normal. Behavior is cooperative.        Thought Content: Thought content normal.     Results for orders placed or performed in visit on 08/27/22  GC/Chlamydia Probe Amp   Specimen: Urine   UR  Result Value Ref Range   Chlamydia trachomatis, NAA Negative Negative   Neisseria Gonorrhoeae by PCR Negative Negative  Microscopic Examination   Urine  Result  Value Ref Range   WBC, UA None seen 0 - 5 /hpf   RBC, Urine 0-2 0 - 2 /hpf   Epithelial Cells (non renal) 0-10 0 - 10 /hpf   Bacteria, UA None seen None seen/Few  CBC with Differential/Platelet  Result Value Ref Range   WBC 5.8 3.4 - 10.8 x10E3/uL   RBC 5.03 3.77 - 5.28 x10E6/uL   Hemoglobin 14.3 11.1 - 15.9 g/dL   Hematocrit 40.9 81.1 - 46.6 %   MCV 87 79 - 97 fL   MCH 28.4 26.6 - 33.0 pg   MCHC 32.6 31.5 - 35.7 g/dL   RDW 91.4 78.2 - 95.6 %   Platelets 212 150 - 450 x10E3/uL   Neutrophils 37 Not Estab. %   Lymphs 52 Not Estab. %   Monocytes 7 Not Estab. %   Eos 3 Not Estab. %   Basos 1 Not Estab. %   Neutrophils Absolute 2.2 1.4 - 7.0 x10E3/uL   Lymphocytes Absolute 3.0 0.7 - 3.1 x10E3/uL   Monocytes Absolute 0.4 0.1 - 0.9 x10E3/uL   EOS (ABSOLUTE) 0.2 0.0 - 0.4 x10E3/uL   Basophils Absolute 0.1 0.0 - 0.2 x10E3/uL   Immature Granulocytes 0 Not Estab. %   Immature Grans (Abs) 0.0 0.0 - 0.1 x10E3/uL  Comprehensive metabolic panel  Result Value Ref Range   Glucose 86 70 - 99 mg/dL   BUN 9 6 - 20 mg/dL   Creatinine, Ser 2.13 0.57 - 1.00 mg/dL   eGFR 086 >57 QI/ONG/2.95   BUN/Creatinine Ratio 11 9 - 23   Sodium 141 134 - 144 mmol/L   Potassium 4.0 3.5 - 5.2 mmol/L   Chloride 104 96 - 106 mmol/L   CO2 23 20 - 29 mmol/L   Calcium 9.4 8.7 - 10.2 mg/dL   Total Protein 7.0 6.0 - 8.5 g/dL   Albumin 4.1 4.0 - 5.0 g/dL   Globulin, Total 2.9 1.5 - 4.5 g/dL   Albumin/Globulin Ratio 1.4 1.2 - 2.2   Bilirubin Total 0.3 0.0 - 1.2 mg/dL   Alkaline Phosphatase 91 44 - 121 IU/L   AST 18 0 - 40 IU/L   ALT 17 0 - 32 IU/L  Lipid Panel w/o Chol/HDL Ratio  Result Value Ref Range   Cholesterol, Total 231 (H)  100 - 199 mg/dL   Triglycerides 161 (H) 0 - 149 mg/dL   HDL 48 >09 mg/dL   VLDL Cholesterol Cal 66 (H) 5 - 40 mg/dL   LDL Chol Calc (NIH) 604 (H) 0 - 99 mg/dL  Urinalysis, Routine w reflex microscopic  Result Value Ref Range   Specific Gravity, UA >1.030 (H) 1.005 - 1.030   pH,  UA 5.0 5.0 - 7.5   Color, UA Yellow Yellow   Appearance Ur Clear Clear   Leukocytes,UA Negative Negative   Protein,UA Negative Negative/Trace   Glucose, UA Negative Negative   Ketones, UA Negative Negative   RBC, UA 2+ (A) Negative   Bilirubin, UA Negative Negative   Urobilinogen, Ur 0.2 0.2 - 1.0 mg/dL   Nitrite, UA Negative Negative   Microscopic Examination See below:   TSH  Result Value Ref Range   TSH 1.220 0.450 - 4.500 uIU/mL  HM PAP SMEAR  Result Value Ref Range   HM Pap smear See Attached Report in Chart       Assessment & Plan:   Problem List Items Addressed This Visit     Recurrent acute serous otitis media of both ears - Primary    Acute, ongoing. Urgent referral placed for ENT as this is x3 occurrence of the year. Will treat with Augmentin BID for 7 days. Recommend use of Zyrtec daily to help with ear fullness. Return if symptoms fail to improve if unable to schedule with ENT.       Relevant Medications   amoxicillin-clavulanate (AUGMENTIN) 875-125 MG tablet   Other Relevant Orders   Ambulatory referral to ENT     Follow up plan: Return if symptoms worsen or fail to improve.

## 2023-07-09 NOTE — Patient Instructions (Signed)
Try Zyrtec daily over the next 7 days

## 2023-08-14 ENCOUNTER — Encounter: Payer: Self-pay | Admitting: Pediatrics

## 2023-08-14 ENCOUNTER — Ambulatory Visit: Payer: 59 | Admitting: Pediatrics

## 2023-08-14 ENCOUNTER — Ambulatory Visit: Payer: Self-pay

## 2023-08-14 VITALS — BP 115/81 | HR 75 | Temp 98.3°F | Resp 16 | Ht 70.47 in | Wt 267.2 lb

## 2023-08-14 DIAGNOSIS — R3915 Urgency of urination: Secondary | ICD-10-CM | POA: Diagnosis not present

## 2023-08-14 DIAGNOSIS — Z1330 Encounter for screening examination for mental health and behavioral disorders, unspecified: Secondary | ICD-10-CM | POA: Diagnosis not present

## 2023-08-14 DIAGNOSIS — Z113 Encounter for screening for infections with a predominantly sexual mode of transmission: Secondary | ICD-10-CM

## 2023-08-14 DIAGNOSIS — R319 Hematuria, unspecified: Secondary | ICD-10-CM | POA: Diagnosis not present

## 2023-08-14 DIAGNOSIS — R109 Unspecified abdominal pain: Secondary | ICD-10-CM | POA: Diagnosis not present

## 2023-08-14 LAB — URINALYSIS, ROUTINE W REFLEX MICROSCOPIC
Bilirubin, UA: NEGATIVE
Glucose, UA: NEGATIVE
Ketones, UA: NEGATIVE
Nitrite, UA: NEGATIVE
Specific Gravity, UA: >1.03 — ABNORMAL HIGH (ref 1.005–1.030)
Urobilinogen, Ur: 0.2 mg/dL (ref 0.2–1.0)
pH, UA: 6 (ref 5.0–7.5)

## 2023-08-14 LAB — WET PREP FOR TRICH, YEAST, CLUE
Clue Cell Exam: POSITIVE — AB
Trichomonas Exam: NEGATIVE
Yeast Exam: NEGATIVE

## 2023-08-14 LAB — PREGNANCY, URINE: Preg Test, Ur: NEGATIVE

## 2023-08-14 LAB — MICROSCOPIC EXAMINATION

## 2023-08-14 MED ORDER — AMOXICILLIN-POT CLAVULANATE 875-125 MG PO TABS: 1.0000 | ORAL_TABLET | Freq: Two times a day (BID) | ORAL | 0 refills | Status: AC

## 2023-08-14 NOTE — Progress Notes (Signed)
 Office Visit  BP 115/81 (BP Location: Left Arm, Patient Position: Sitting, Cuff Size: Large)   Pulse 75   Temp 98.3 F (36.8 C) (Oral)   Resp 16   Ht 5' 10.47 (1.79 m)   Wt 267 lb 3.2 oz (121.2 kg)   LMP 07/25/2023 (Exact Date)   SpO2 97%   BMI 37.83 kg/m    Subjective:    Patient ID: Deborah Summers, female    DOB: 04-09-2001, 23 y.o.   MRN: [id number]  HPI: Deborah Summers is a 23 y.o. female  Chief Complaint  Patient presents with   Urinary Tract Infection    Can't empty bladder, urgency, blood in urine in urine started this morning. Started over the weekend. Back pain also started this morning.     Discussed the use of AI scribe software for clinical note transcription with the patient, who gave verbal consent to proceed.  History of Present Illness   The patient presented with recent urinary symptoms and lower back pain. Over the weekend, the patient began experiencing a sensation of incomplete bladder emptying, necessitating frequent urination. This was followed by the onset of hematuria and lower back pain on both sides. The patient denied any previous similar episodes. Has had intermittent nausea. No vomiting, fevers, chills. She had blood in urine today prompting concern.   In addition to these symptoms, the patient expressed concerns about potential pregnancy. The patient had a Mirena IUD inserted on the 19th of December and engaged in unprotected sexual intercourse two days later. The patient was unsure about the effectiveness of the IUD at that time. Since then, the patient has experienced nausea, loss of appetite, and cramping, but denied any other symptoms suggestive of pregnancy. A home pregnancy test taken on the Monday prior to the consultation was negative.     Relevant past medical, surgical, family and social history reviewed and updated as indicated. Interim medical history since our last visit reviewed. Allergies and medications reviewed and updated.  ROS  per HPI unless specifically indicated above     Objective:    BP 115/81 (BP Location: Left Arm, Patient Position: Sitting, Cuff Size: Large)   Pulse 75   Temp 98.3 F (36.8 C) (Oral)   Resp 16   Ht 5' 10.47 (1.79 m)   Wt 267 lb 3.2 oz (121.2 kg)   LMP 07/25/2023 (Exact Date)   SpO2 97%   BMI 37.83 kg/m   Wt Readings from Last 3 Encounters:  08/14/23 267 lb 3.2 oz (121.2 kg)  07/09/23 268 lb 3.2 oz (121.7 kg)  06/07/23 264 lb 6.4 oz (119.9 kg)     Physical Exam Constitutional:      Appearance: Normal appearance.  HENT:     Head: Normocephalic and atraumatic.  Eyes:     Pupils: Pupils are equal, round, and reactive to light.  Pulmonary:     Effort: Pulmonary effort is normal.  Abdominal:     General: Abdomen is flat. There is no distension.     Palpations: Abdomen is soft.     Tenderness: There is no right CVA tenderness, left CVA tenderness, guarding or rebound.  Musculoskeletal:        General: Normal range of motion.     Cervical back: Normal range of motion.  Skin:    General: Skin is warm and dry.     Capillary Refill: Capillary refill takes less than 2 seconds.  Neurological:     General: No focal deficit present.  Mental Status: She is alert. Mental status is at baseline.  Psychiatric:        Mood and Affect: Mood normal.        Behavior: Behavior normal.         07/09/2023    1:22 PM 01/14/2023    4:34 PM 08/27/2022    9:35 AM 02/23/2022    9:32 AM 01/19/2022    2:13 PM  Depression screen PHQ 2/9  Decreased Interest 0 0 0 0 0  Down, Depressed, Hopeless 0 0 0 0 0  PHQ - 2 Score 0 0 0 0 0  Altered sleeping 0 0 0 0 0  Tired, decreased energy 0 0 0 0 0  Change in appetite 0 0 0 0 0  Feeling bad or failure about yourself  0 0 0 0 0  Trouble concentrating 0 0 0 0 0  Moving slowly or fidgety/restless 0 0 0 0 0  Suicidal thoughts 0 0 0 0 0  PHQ-9 Score 0 0 0 0 0  Difficult doing work/chores Not difficult at all Not difficult at all Not difficult at  all  Not difficult at all       07/09/2023    1:22 PM 01/14/2023    4:34 PM 08/27/2022    9:35 AM 02/23/2022    9:32 AM  GAD 7 : Generalized Anxiety Score  Nervous, Anxious, on Edge 0 0 0 0  Control/stop worrying 0 0 0 0  Worry too much - different things 0 0 0 0  Trouble relaxing 0 0 0 0  Restless 0 0 0 0  Easily annoyed or irritable 0 0 0 0  Afraid - awful might happen 0 0 0 0  Total GAD 7 Score 0 0 0 0  Anxiety Difficulty Not difficult at all Not difficult at all Not difficult at all Not difficult at all      Assessment & Plan:  Assessment & Plan   Hematuria, unspecified type Urinary urgency New onset of urinary frequency, hematuria, and bilateral lower back pain. No history of kidney stones. No fever, chills, nausea, or vomiting. Suspect hematuria from UTI. Pt given strict return precautions and anticipatory guidance for ED if new or worsening symptoms. -Collected urine for culture pending -Start empiric antibiotic therapy for suspected UTI, 1+ leuks on rapid u/a -Check for pregnancy due to recent unprotected intercourse post-IUD insertion. -     Amoxicillin -Pot Clavulanate; Take 1 tablet by mouth 2 (two) times daily for 5 days.  Dispense: 5 tablet; Refill: 0 -     Urinalysis, Routine w reflex microscopic -     Urine Culture -     Pregnancy, urine -     WET PREP FOR TRICH, YEAST, CLUE -     Amoxicillin -Pot Clavulanate; Take 1 tablet by mouth 2 (two) times daily for 5 days.  Dispense: 5 tablet; Refill: 0  Abdominal cramping Screen for STD (sexually transmitted disease) Recent unprotected intercourse after Mirena IUD insertion. No confirmation of correct placement yet. Mild nausea and cramping reported, suspect from above. Home pregnancy test negative on Monday. Will also send vaginal swab and STD panel in case contributing. Will check serum level. Exam reassuring. -     hCG, serum, qualitative -     Chlamydia/Gonococcus/Trichomonas, NAA  Encounter for behavioral health  screening As part of their intake evaluation, the patient was screened for depression, anxiety.  PHQ9 SCORE 0, GAD7 SCORE 0. Screening results negative for tested conditions. Continue to monitor.  Follow up plan: Return in about 1 week (around 08/21/2023) for UTI.  Ozzie Knobel SHAUNNA NETT, MD  Approximately 30 minutes spent on patient encounter today including assessment, counseling, diagnosing, treatment plan development, and charting.

## 2023-08-14 NOTE — Telephone Encounter (Signed)
 Chief Complaint: Increased Urgency Symptoms: increased urine urgency and frequency, blood in the urine, 6/10 lower back pain, pregnancy possible  Frequency: constant Pertinent Negatives: Patient denies fever, foul urine odor, burning Disposition: [] ED /[] Urgent Care (no appt availability in office) / [x] Appointment(In office/virtual)/ []  [facility]/ [] Home Care/ [] Refused Recommended Disposition /[] [facility]/ []  Follow-up with PCP Additional Notes: Patient stated over the weekend she noticed an increase in urine urgency and frequency. Patient stated she began to have lower back pain and today every time she has urinated she has noticed some blood in the urine. Patient states she does not feel like she is completely emptying the bladder when she goes. Patient stated it is a possibility that she is pregnant LMP 07/25/23. Care advice was given and patient has been scheduled for an appointment today at 1340.   Summary: poss uti   Pt has been having back pain, unable to empty bladder all the way, a tinge of blood in the urine this morning, and requesting appt asap.  Pt is flexible any time she can be seen.     Reason for Disposition  Side (flank) or lower back pain present  Answer Assessment - Initial Assessment Questions 1. SYMPTOM: What's the main symptom you're concerned about? (e.g., frequency, incontinence)     Increase urgency  2. ONSET: When did the  urgency  start?     Over the weekend 3. PAIN: Is there any pain? If Yes, ask: How bad is it? (Scale: 1-10; mild, moderate, severe)     6/10 4. CAUSE: What do you think is causing the symptoms?     UTI 5. OTHER SYMPTOMS: Do you have any other symptoms? (e.g., blood in urine, fever, flank pain, pain with urination)     Back pain, increase urgency, blood in the urine today 6. PREGNANCY: Is there any chance you are pregnant? When was your last menstrual period?     It is possible, LMP 07/1923  Protocols  used: Urinary Symptoms-A-AH

## 2023-08-15 ENCOUNTER — Other Ambulatory Visit: Payer: Self-pay | Admitting: Pediatrics

## 2023-08-15 DIAGNOSIS — B3731 Acute candidiasis of vulva and vagina: Secondary | ICD-10-CM

## 2023-08-15 LAB — HCG, SERUM, QUALITATIVE: hCG,Beta Subunit,Qual,Serum: NEGATIVE m[IU]/mL (ref <6)

## 2023-08-15 LAB — CHLAMYDIA/GONOCOCCUS/TRICHOMONAS, NAA
Chlamydia by NAA: NEGATIVE
Gonococcus by NAA: NEGATIVE
Trich vag by NAA: NEGATIVE

## 2023-08-15 MED ORDER — FLUCONAZOLE 150 MG PO TABS: 150.0000 mg | ORAL_TABLET | Freq: Once | ORAL | 0 refills | Status: AC

## 2023-08-16 LAB — URINE CULTURE

## 2023-08-21 ENCOUNTER — Other Ambulatory Visit: Payer: Self-pay | Admitting: Pediatrics

## 2023-08-21 DIAGNOSIS — B9689 Other specified bacterial agents as the cause of diseases classified elsewhere: Secondary | ICD-10-CM

## 2023-08-21 MED ORDER — METRONIDAZOLE 500 MG PO TABS: 500.0000 mg | ORAL_TABLET | Freq: Two times a day (BID) | ORAL | 0 refills | Status: AC

## 2023-08-21 NOTE — Progress Notes (Signed)
 Flagyl  sent for BV.  Deborah Josten Jilda Most, MD

## 2023-08-22 ENCOUNTER — Ambulatory Visit: Payer: 59 | Admitting: Pediatrics

## 2023-08-22 ENCOUNTER — Encounter: Payer: Self-pay | Admitting: Pediatrics

## 2023-08-22 VITALS — BP 119/75 | HR 80 | Temp 98.9°F | Resp 16 | Wt 271.2 lb

## 2023-08-22 DIAGNOSIS — N760 Acute vaginitis: Secondary | ICD-10-CM

## 2023-08-22 DIAGNOSIS — B9689 Other specified bacterial agents as the cause of diseases classified elsewhere: Secondary | ICD-10-CM

## 2023-08-22 DIAGNOSIS — Z1330 Encounter for screening examination for mental health and behavioral disorders, unspecified: Secondary | ICD-10-CM | POA: Diagnosis not present

## 2023-08-22 DIAGNOSIS — N3001 Acute cystitis with hematuria: Secondary | ICD-10-CM

## 2023-08-22 NOTE — Progress Notes (Signed)
Office Visit  BP 119/75 (BP Location: Left Arm, Patient Position: Sitting, Cuff Size: Large)   Pulse 80   Temp 98.9 F (37.2 C) (Oral)   Resp 16   Wt 271 lb 3.2 oz (123 kg)   LMP 07/25/2023 (Exact Date)   SpO2 98%   BMI 38.39 kg/m    Subjective:    Patient ID: Deborah Summers, female    DOB: 09-09-2000, 23 y.o.   MRN: [id number]  HPI: Deborah Summers is a 23 y.o. female  Chief Complaint  Patient presents with   Urinary Tract Infection    Resolved and finished the abx's    BV    Started abx's yesterday, going good.     Discussed the use of AI scribe software for clinical note transcription with the patient, who gave verbal consent to proceed.  History of Present Illness   The patient, with a recent history of gastrointestinal symptoms and vaginal discomfort, reports improvement in GI symptoms after taking Augmentin. She denies any GI bleeding. However, despite starting Flagyl, the vaginal symptoms remain unchanged. The patient has also been prescribed Diflucan, which she has not yet started. She has a history of yeast infections and is aware of the associated symptoms.  The patient denies any abdominal or back pain. She has not experienced any further episodes of hematuria since the last consultation.  The patient also has an intrauterine device (IUD) in place for about a month, primarily for contraception and management of heavy periods. She reports feeling crampy a few days ago but has not started bleeding. She is aware that the IUD may lead to lighter periods or amenorrhea.     Relevant past medical, surgical, family and social history reviewed and updated as indicated. Interim medical history since our last visit reviewed. Allergies and medications reviewed and updated.  ROS per HPI unless specifically indicated above     Objective:    BP 119/75 (BP Location: Left Arm, Patient Position: Sitting, Cuff Size: Large)   Pulse 80   Temp 98.9 F (37.2 C) (Oral)   Resp 16    Wt 271 lb 3.2 oz (123 kg)   LMP 07/25/2023 (Exact Date)   SpO2 98%   BMI 38.39 kg/m   Wt Readings from Last 3 Encounters:  08/22/23 271 lb 3.2 oz (123 kg)  08/14/23 267 lb 3.2 oz (121.2 kg)  07/09/23 268 lb 3.2 oz (121.7 kg)     Physical Exam Constitutional:      Appearance: Normal appearance.  Pulmonary:     Effort: Pulmonary effort is normal.  Musculoskeletal:        General: Normal range of motion.  Skin:    Comments: Normal skin color  Neurological:     General: No focal deficit present.     Mental Status: She is alert. Mental status is at baseline.  Psychiatric:        Mood and Affect: Mood normal.        Behavior: Behavior normal.        Thought Content: Thought content normal.         07/09/2023    1:22 PM 01/14/2023    4:34 PM 08/27/2022    9:35 AM 02/23/2022    9:32 AM 01/19/2022    2:13 PM  Depression screen PHQ 2/9  Decreased Interest 0 0 0 0 0  Down, Depressed, Hopeless 0 0 0 0 0  PHQ - 2 Score 0 0 0 0 0  Altered sleeping  0 0 0 0 0  Tired, decreased energy 0 0 0 0 0  Change in appetite 0 0 0 0 0  Feeling bad or failure about yourself  0 0 0 0 0  Trouble concentrating 0 0 0 0 0  Moving slowly or fidgety/restless 0 0 0 0 0  Suicidal thoughts 0 0 0 0 0  PHQ-9 Score 0 0 0 0 0  Difficult doing work/chores Not difficult at all Not difficult at all Not difficult at all  Not difficult at all       07/09/2023    1:22 PM 01/14/2023    4:34 PM 08/27/2022    9:35 AM 02/23/2022    9:32 AM  GAD 7 : Generalized Anxiety Score  Nervous, Anxious, on Edge 0 0 0 0  Control/stop worrying 0 0 0 0  Worry too much - different things 0 0 0 0  Trouble relaxing 0 0 0 0  Restless 0 0 0 0  Easily annoyed or irritable 0 0 0 0  Afraid - awful might happen 0 0 0 0  Total GAD 7 Score 0 0 0 0  Anxiety Difficulty Not difficult at all Not difficult at all Not difficult at all Not difficult at all       Assessment & Plan:  Assessment & Plan   BV (bacterial  vaginosis) Persistent symptoms after 1 day of Metronidazole. No improvement yet, but expected within 3-5 days. -Continue Metronidazole. -Consider Diflucan if yeast infection symptoms develop. -If symptoms persist, follow up with Dr. Laural Benes for re-evaluation and possible swab.  Acute cystitis with hematuria No recurrence of blood in urine since starting Augmentin (completed 2 days and sx resolved). No abdominal or back pain. Unclear if true hematuria vs spotting/menses given IUD recently placed and has had some spotting. -Continue monitoring. -If hematuria recurs, consider imaging or urology referral for possible kidney stone.  Encounter for behavioral health screening As part of their intake evaluation, the patient was screened for depression, anxiety.  PHQ9 SCORE 0, GAD7 SCORE 0. Screening results negative for tested conditions. CTM.  Follow up plan: Return if symptoms worsen or fail to improve.  Anita Mcadory Howell Pringle, MD

## 2023-08-22 NOTE — Patient Instructions (Signed)
Continue to take matronidazole

## 2023-08-26 ENCOUNTER — Encounter: Payer: Self-pay | Admitting: Pediatrics

## 2023-08-29 ENCOUNTER — Encounter: Payer: Self-pay | Admitting: Family Medicine

## 2023-08-29 ENCOUNTER — Ambulatory Visit (INDEPENDENT_AMBULATORY_CARE_PROVIDER_SITE_OTHER): Payer: 59 | Admitting: Family Medicine

## 2023-08-29 VITALS — BP 114/75 | HR 76 | Temp 98.3°F | Ht 69.0 in | Wt 273.2 lb

## 2023-08-29 DIAGNOSIS — Z0000 Encounter for general adult medical examination without abnormal findings: Secondary | ICD-10-CM

## 2023-08-29 MED ORDER — EMGALITY 120 MG/ML ~~LOC~~ SOSY: 120.0000 mg | PREFILLED_SYRINGE | SUBCUTANEOUS | 12 refills | Status: DC

## 2023-08-29 NOTE — Progress Notes (Signed)
BP 114/75   Pulse 76   Temp 98.3 F (36.8 C) (Oral)   Ht 5\' 9"  (1.753 m)   Wt 273 lb 3.2 oz (123.9 kg)   LMP 07/25/2023 (Exact Date)   SpO2 98%   BMI 40.34 kg/m    Subjective:    Patient ID: Deborah Summers, female    DOB: 2001/05/26, 23 y.o.   MRN: [id number]  HPI: Deborah Summers is a 23 y.o. female presenting on 08/29/2023 for comprehensive medical examination. Current medical complaints include: migraines has been doing well. No concerns.   Menopausal Symptoms: no  Depression Screen done today and results listed below:     08/29/2023    9:09 AM 07/09/2023    1:22 PM 01/14/2023    4:34 PM 08/27/2022    9:35 AM 02/23/2022    9:32 AM  Depression screen PHQ 2/9  Decreased Interest 0 0 0 0 0  Down, Depressed, Hopeless 0 0 0 0 0  PHQ - 2 Score 0 0 0 0 0  Altered sleeping 0 0 0 0 0  Tired, decreased energy 0 0 0 0 0  Change in appetite 0 0 0 0 0  Feeling bad or failure about yourself  0 0 0 0 0  Trouble concentrating 0 0 0 0 0  Moving slowly or fidgety/restless 0 0 0 0 0  Suicidal thoughts 0 0 0 0 0  PHQ-9 Score 0 0 0 0 0  Difficult doing work/chores Not difficult at all Not difficult at all Not difficult at all Not difficult at all     Past Medical History:  Past Medical History:  Diagnosis Date   Cold    getting over a cold/cough   Headache    Migraine    Patient denies medical problems     Surgical History:  Past Surgical History:  Procedure Laterality Date   TONSILLECTOMY AND ADENOIDECTOMY Bilateral 10/28/2015   Procedure: TONSILLECTOMY AND ADENOIDECTOMY;  Surgeon: Linus Salmons, MD;  Location: [facility];  Service: ENT;  Laterality: Bilateral;    Medications:  Current Outpatient Medications on File Prior to Visit  Medication Sig   eletriptan (RELPAX) 40 MG tablet    escitalopram (LEXAPRO) 20 MG tablet TAKE 1 TAB ONCE DAILY AND CONTINUE   levonorgestrel (MIRENA) 20 MCG/DAY IUD 1 each by Intrauterine route once. Inserted 07/25/2023   mometasone  (ELOCON) 0.1 % lotion APPLY TO ITCHY EARS TWICE DAILY FOR 12 DAYS. MAY REPEAT AS NEEDED FOR ITCHY EARS   ondansetron (ZOFRAN-ODT) 4 MG disintegrating tablet    No current facility-administered medications on file prior to visit.    Allergies:  Allergies  Allergen Reactions   Tape Rash    Social History:  Social History   Socioeconomic History   Marital status: Single    Spouse name: Not on file   Number of children: 0   Years of education: Not on file   Highest education level: Not on file  Occupational History   Not on file  Tobacco Use   Smoking status: Never   Smokeless tobacco: Never  Vaping Use   Vaping status: Never Used  Substance and Sexual Activity   Alcohol use: No   Drug use: No   Sexual activity: Yes    Birth control/protection: None  Other Topics Concern   Not on file  Social History Narrative   Not on file   Social Drivers of Health   Financial Resource Strain: Low Risk  (08/22/2023)  Overall Financial Resource Strain (CARDIA)    Difficulty of Paying Living Expenses: Not hard at all  Food Insecurity: No Food Insecurity (08/22/2023)   Hunger Vital Sign    Worried About Running Out of Food in the Last Year: Never true    Ran Out of Food in the Last Year: Never true  Transportation Needs: No Transportation Needs (08/22/2023)   PRAPARE - Administrator, Civil Service (Medical): No    Lack of Transportation (Non-Medical): No  Physical Activity: Insufficiently Active (08/22/2023)   Exercise Vital Sign    Days of Exercise per Week: 3 days    Minutes of Exercise per Session: 30 min  Stress: No Stress Concern Present (08/22/2023)   [employer] of Occupational Health - Occupational Stress Questionnaire    Feeling of Stress : Only a little  Social Connections: Socially Isolated (08/22/2023)   Social Connection and Isolation Panel [NHANES]    Frequency of Communication with Friends and Family: Three times a week    Frequency of Social  Gatherings with Friends and Family: Twice a week    Attends Religious Services: Never    Database administrator or Organizations: No    Attends Banker Meetings: Never    Marital Status: Never married  Intimate Partner Violence: Not At Risk (08/22/2023)   Humiliation, Afraid, Rape, and Kick questionnaire    Fear of Current or Ex-Partner: No    Emotionally Abused: No    Physically Abused: No    Sexually Abused: No   Social History   Tobacco Use  Smoking Status Never  Smokeless Tobacco Never   Social History   Substance and Sexual Activity  Alcohol Use No    Family History:  Family History  Problem Relation Age of Onset   Hypertension Mother    Heart disease Father    Anxiety disorder Brother    Uterine cancer Maternal Grandmother    Heart disease Maternal Grandfather    Multiple sclerosis Paternal Grandmother    Heart disease Paternal Grandfather     Past medical history, surgical history, medications, allergies, family history and social history reviewed with patient today and changes made to appropriate areas of the chart.   Review of Systems  Constitutional: Negative.   HENT: Negative.    Eyes: Negative.   Respiratory: Negative.    Cardiovascular: Negative.   Gastrointestinal: Negative.   Genitourinary: Negative.   Musculoskeletal: Negative.   Skin: Negative.   Neurological: Negative.   Endo/Heme/Allergies: Negative.   Psychiatric/Behavioral: Negative.     All other ROS negative except what is listed above and in the HPI.      Objective:    BP 114/75   Pulse 76   Temp 98.3 F (36.8 C) (Oral)   Ht 5\' 9"  (1.753 m)   Wt 273 lb 3.2 oz (123.9 kg)   LMP 07/25/2023 (Exact Date)   SpO2 98%   BMI 40.34 kg/m   Wt Readings from Last 3 Encounters:  08/29/23 273 lb 3.2 oz (123.9 kg)  08/22/23 271 lb 3.2 oz (123 kg)  08/14/23 267 lb 3.2 oz (121.2 kg)    Physical Exam Vitals and nursing note reviewed.  Constitutional:      General: She is not  in acute distress.    Appearance: Normal appearance. She is not ill-appearing, toxic-appearing or diaphoretic.  HENT:     Head: Normocephalic and atraumatic.     Right Ear: Tympanic membrane, ear canal and external ear normal. There  is no impacted cerumen.     Left Ear: Tympanic membrane, ear canal and external ear normal. There is no impacted cerumen.     Nose: Nose normal. No congestion or rhinorrhea.     Mouth/Throat:     Mouth: Mucous membranes are moist.     Pharynx: Oropharynx is clear. No oropharyngeal exudate or posterior oropharyngeal erythema.  Eyes:     General: No scleral icterus.       Right eye: No discharge.        Left eye: No discharge.     Extraocular Movements: Extraocular movements intact.     Conjunctiva/sclera: Conjunctivae normal.     Pupils: Pupils are equal, round, and reactive to light.  Neck:     Vascular: No carotid bruit.  Cardiovascular:     Rate and Rhythm: Normal rate and regular rhythm.     Pulses: Normal pulses.     Heart sounds: No murmur heard.    No friction rub. No gallop.  Pulmonary:     Effort: Pulmonary effort is normal. No respiratory distress.     Breath sounds: Normal breath sounds. No stridor. No wheezing, rhonchi or rales.  Chest:     Chest wall: No tenderness.  Abdominal:     General: Abdomen is flat. Bowel sounds are normal. There is no distension.     Palpations: Abdomen is soft. There is no mass.     Tenderness: There is no abdominal tenderness. There is no right CVA tenderness, left CVA tenderness, guarding or rebound.     Hernia: No hernia is present.  Genitourinary:    Comments: Breast and pelvic exams deferred with shared decision making Musculoskeletal:        General: No swelling, tenderness, deformity or signs of injury.     Cervical back: Normal range of motion and neck supple. No rigidity. No muscular tenderness.     Right lower leg: No edema.     Left lower leg: No edema.  Lymphadenopathy:     Cervical: No cervical  adenopathy.  Skin:    General: Skin is warm and dry.     Capillary Refill: Capillary refill takes less than 2 seconds.     Coloration: Skin is not jaundiced or pale.     Findings: No bruising, erythema, lesion or rash.  Neurological:     General: No focal deficit present.     Mental Status: She is alert and oriented to person, place, and time. Mental status is at baseline.     Cranial Nerves: No cranial nerve deficit.     Sensory: No sensory deficit.     Motor: No weakness.     Coordination: Coordination normal.     Gait: Gait normal.     Deep Tendon Reflexes: Reflexes normal.  Psychiatric:        Mood and Affect: Mood normal.        Behavior: Behavior normal.        Thought Content: Thought content normal.        Judgment: Judgment normal.     Results for orders placed or performed in visit on 08/14/23  Urine Culture   Collection Time: 08/14/23  1:58 PM   Specimen: Urine   UR  Result Value Ref Range   Urine Culture, Routine Final report (A)    Organism ID, Bacteria Comment (A)   WET PREP FOR TRICH, YEAST, CLUE   Collection Time: 08/14/23  1:58 PM   Specimen: Sterile Swab   Sterile  Swab  Result Value Ref Range   Trichomonas Exam Negative Negative   Yeast Exam Negative Negative   Clue Cell Exam Positive (A) Negative  Chlamydia/Gonococcus/Trichomonas, NAA([facility])   Collection Time: 08/14/23  1:58 PM   Specimen: Urine   UR  Result Value Ref Range   Chlamydia by NAA Negative Negative   Gonococcus by NAA Negative Negative   Trich vag by NAA Negative Negative  Microscopic Examination   Collection Time: 08/14/23  1:58 PM   Urine  Result Value Ref Range   WBC, UA 0-5 0 - 5 /hpf   RBC, Urine >30R 0 - 2 /hpf   Epithelial Cells (non renal) 0-10 0 - 10 /hpf   Bacteria, UA Few None seen/Few  Urinalysis, Routine w reflex microscopic   Collection Time: 08/14/23  1:58 PM  Result Value Ref Range   Specific Gravity, UA >1.030 (H) 1.005 - 1.030   pH, UA 6.0 5.0 - 7.5    Color, UA Amber (A) Yellow   Appearance Ur Turbid (A) Clear   Leukocytes,UA 1+ (A) Negative   Protein,UA 2+ (A) Negative/Trace   Glucose, UA Negative Negative   Ketones, UA Negative Negative   RBC, UA 3+ (A) Negative   Bilirubin, UA Negative Negative   Urobilinogen, Ur 0.2 0.2 - 1.0 mg/dL   Nitrite, UA Negative Negative   Microscopic Examination See below:   Pregnancy, urine   Collection Time: 08/14/23  1:58 PM  Result Value Ref Range   Preg Test, Ur Negative Negative  hCG, serum, qualitative   Collection Time: 08/14/23  2:04 PM  Result Value Ref Range   hCG,Beta Subunit,Qual,Serum Negative Negative <6 mIU/mL      Assessment & Plan:   Problem List Items Addressed This Visit   None Visit Diagnoses       Routine general medical examination at a health care facility    -  Primary   Vaccines up to date. Screening labs checked today. Pap up to date. Continue diet and exercise. Call with any cocerns.   Relevant Orders   CBC with Differential/Platelet   Comprehensive metabolic panel   Lipid Panel w/o Chol/HDL Ratio   TSH   GC/Chlamydia Probe Amp        Follow up plan: Return in about 1 year (around 08/28/2024) for physical.   LABORATORY TESTING:  - Pap smear: up to date  IMMUNIZATIONS:   - Tdap: Tetanus vaccination status reviewed: last tetanus booster within 10 years. - Influenza: Up to date - Pneumovax: Not applicable - Prevnar: Not applicable - COVID: Up to date - HPV: Up to date  PATIENT COUNSELING:   Advised to take 1 mg of folate supplement per day if capable of pregnancy.   Sexuality: Discussed sexually transmitted diseases, partner selection, use of condoms, avoidance of unintended pregnancy  and contraceptive alternatives.   Advised to avoid cigarette smoking.  I discussed with the patient that most people either abstain from alcohol or drink within safe limits (<=14/week and <=4 drinks/occasion for males, <=7/weeks and <= 3 drinks/occasion for females)  and that the risk for alcohol disorders and other health effects rises proportionally with the number of drinks per week and how often a drinker exceeds daily limits.  Discussed cessation/primary prevention of drug use and availability of treatment for abuse.   Diet: Encouraged to adjust caloric intake to maintain  or achieve ideal body weight, to reduce intake of dietary saturated fat and total fat, to limit sodium intake by avoiding high sodium  foods and not adding table salt, and to maintain adequate dietary potassium and calcium preferably from fresh fruits, vegetables, and low-fat dairy products.    stressed the importance of regular exercise  Injury prevention: Discussed safety belts, safety helmets, smoke detector, smoking near bedding or upholstery.   Dental health: Discussed importance of regular tooth brushing, flossing, and dental visits.    NEXT PREVENTATIVE PHYSICAL DUE IN 1 YEAR. Return in about 1 year (around 08/28/2024) for physical.

## 2023-08-30 LAB — CBC WITH DIFFERENTIAL/PLATELET
Basophils Absolute: 0 10*3/uL (ref 0.0–0.2)
Basos: 1 %
EOS (ABSOLUTE): 0.1 10*3/uL (ref 0.0–0.4)
Eos: 2 %
Hematocrit: 42.8 % (ref 34.0–46.6)
Hemoglobin: 13.8 g/dL (ref 11.1–15.9)
Immature Grans (Abs): 0 10*3/uL (ref 0.0–0.1)
Immature Granulocytes: 0 %
Lymphocytes Absolute: 2.3 10*3/uL (ref 0.7–3.1)
Lymphs: 29 %
MCH: 29.1 pg (ref 26.6–33.0)
MCHC: 32.2 g/dL (ref 31.5–35.7)
MCV: 90 fL (ref 79–97)
Monocytes Absolute: 0.6 10*3/uL (ref 0.1–0.9)
Monocytes: 7 %
Neutrophils Absolute: 4.7 10*3/uL (ref 1.4–7.0)
Neutrophils: 61 %
Platelets: 227 10*3/uL (ref 150–450)
RBC: 4.75 x10E6/uL (ref 3.77–5.28)
RDW: 13.1 % (ref 11.7–15.4)
WBC: 7.7 10*3/uL (ref 3.4–10.8)

## 2023-08-30 LAB — COMPREHENSIVE METABOLIC PANEL
ALT: 28 [IU]/L (ref 0–32)
AST: 37 [IU]/L (ref 0–40)
Albumin: 4.1 g/dL (ref 4.0–5.0)
Alkaline Phosphatase: 103 [IU]/L (ref 44–121)
BUN/Creatinine Ratio: 12 (ref 9–23)
BUN: 9 mg/dL (ref 6–20)
Bilirubin Total: 0.2 mg/dL (ref 0.0–1.2)
CO2: 20 mmol/L (ref 20–29)
Calcium: 9.1 mg/dL (ref 8.7–10.2)
Chloride: 104 mmol/L (ref 96–106)
Creatinine, Ser: 0.77 mg/dL (ref 0.57–1.00)
Globulin, Total: 2.4 g/dL (ref 1.5–4.5)
Glucose: 96 mg/dL (ref 70–99)
Potassium: 4.3 mmol/L (ref 3.5–5.2)
Sodium: 139 mmol/L (ref 134–144)
Total Protein: 6.5 g/dL (ref 6.0–8.5)
eGFR: 112 mL/min/{1.73_m2} (ref >59)

## 2023-08-30 LAB — TSH: TSH: 1.16 u[IU]/mL (ref 0.450–4.500)

## 2023-08-30 LAB — LIPID PANEL W/O CHOL/HDL RATIO
Cholesterol, Total: 192 mg/dL (ref 100–199)
HDL: 44 mg/dL (ref >39)
LDL Chol Calc (NIH): 114 mg/dL — ABNORMAL HIGH (ref 0–99)
Triglycerides: 196 mg/dL — ABNORMAL HIGH (ref 0–149)
VLDL Cholesterol Cal: 34 mg/dL (ref 5–40)

## 2023-09-01 ENCOUNTER — Encounter: Payer: Self-pay | Admitting: Family Medicine

## 2023-09-02 LAB — GC/CHLAMYDIA PROBE AMP
Chlamydia trachomatis, NAA: NEGATIVE
Neisseria Gonorrhoeae by PCR: NEGATIVE

## 2023-10-06 ENCOUNTER — Encounter: Payer: Self-pay | Admitting: Family Medicine

## 2023-10-25 ENCOUNTER — Encounter: Payer: Self-pay | Admitting: Family Medicine

## 2023-10-25 ENCOUNTER — Ambulatory Visit: Payer: Self-pay | Admitting: Family Medicine

## 2023-10-25 MED ORDER — BUPROPION HCL ER (SR) 150 MG PO TB12: ORAL_TABLET | ORAL | 3 refills | Status: DC

## 2023-10-25 NOTE — Progress Notes (Signed)
 BP 109/72 (BP Location: Left Arm, Patient Position: Sitting, Cuff Size: Large)   Pulse 74   Temp 98.6 F (37 C) (Oral)   Resp 16   Ht 5' 9.02" (1.753 m)   Wt 272 lb 9.6 oz (123.7 kg)   SpO2 98%   BMI 40.24 kg/m    Subjective:    Patient ID: Deborah Summers, female    DOB: [birth year], 23 y.o.   MRN: [id number]  HPI: Deborah Summers is a 23 y.o. female  Chief Complaint  Patient presents with   Weight Loss    Wants to discuss medications. Understands insurance does not cover GLPs.    WEIGHT GAIN Duration: chronic Previous attempts at weight loss: yes, has been working out 6 days a week, high protein low calorie Complications of obesity: none Peak weight: 273lbs Weight loss goal: 200 Weight loss to date: 1lb Requesting obesity pharmacotherapy: yes Current weight loss supplements/medications: no Previous weight loss supplements/meds: no  Relevant past medical, surgical, family and social history reviewed and updated as indicated. Interim medical history since our last visit reviewed. Allergies and medications reviewed and updated.  Review of Systems  Constitutional: Negative.   Respiratory: Negative.    Cardiovascular: Negative.   Musculoskeletal: Negative.   Neurological: Negative.   Psychiatric/Behavioral: Negative.      Per HPI unless specifically indicated above     Objective:    BP 109/72 (BP Location: Left Arm, Patient Position: Sitting, Cuff Size: Large)   Pulse 74   Temp 98.6 F (37 C) (Oral)   Resp 16   Ht 5' 9.02" (1.753 m)   Wt 272 lb 9.6 oz (123.7 kg)   SpO2 98%   BMI 40.24 kg/m   Wt Readings from Last 3 Encounters:  10/25/23 272 lb 9.6 oz (123.7 kg)  08/29/23 273 lb 3.2 oz (123.9 kg)  08/22/23 271 lb 3.2 oz (123 kg)    Physical Exam Vitals and nursing note reviewed.  Constitutional:      General: She is not in acute distress.    Appearance: Normal appearance. She is obese. She is not ill-appearing, toxic-appearing or diaphoretic.  HENT:      Head: Normocephalic and atraumatic.     Right Ear: External ear normal.     Left Ear: External ear normal.     Nose: Nose normal.     Mouth/Throat:     Mouth: Mucous membranes are moist.     Pharynx: Oropharynx is clear.  Eyes:     General: No scleral icterus.       Right eye: No discharge.        Left eye: No discharge.     Extraocular Movements: Extraocular movements intact.     Conjunctiva/sclera: Conjunctivae normal.     Pupils: Pupils are equal, round, and reactive to light.  Cardiovascular:     Rate and Rhythm: Normal rate and regular rhythm.     Pulses: Normal pulses.     Heart sounds: Normal heart sounds. No murmur heard.    No friction rub. No gallop.  Pulmonary:     Effort: Pulmonary effort is normal. No respiratory distress.     Breath sounds: Normal breath sounds. No stridor. No wheezing, rhonchi or rales.  Chest:     Chest wall: No tenderness.  Musculoskeletal:        General: Normal range of motion.     Cervical back: Normal range of motion and neck supple.  Skin:    General: Skin  is warm and dry.     Capillary Refill: Capillary refill takes less than 2 seconds.     Coloration: Skin is not jaundiced or pale.     Findings: No bruising, erythema, lesion or rash.  Neurological:     General: No focal deficit present.     Mental Status: She is alert and oriented to person, place, and time. Mental status is at baseline.  Psychiatric:        Mood and Affect: Mood normal.        Behavior: Behavior normal.        Thought Content: Thought content normal.        Judgment: Judgment normal.     Results for orders placed or performed in visit on 08/29/23  GC/Chlamydia Probe Amp   Collection Time: 08/29/23  9:20 AM   Specimen: Urine   UR  Result Value Ref Range   Chlamydia trachomatis, NAA Negative Negative   Neisseria Gonorrhoeae by PCR Negative Negative  CBC with Differential/Platelet   Collection Time: 08/29/23  9:20 AM  Result Value Ref Range   WBC 7.7  3.4 - 10.8 x10E3/uL   RBC 4.75 3.77 - 5.28 x10E6/uL   Hemoglobin 13.8 11.1 - 15.9 g/dL   Hematocrit 24.4 01.0 - 46.6 %   MCV 90 79 - 97 fL   MCH 29.1 26.6 - 33.0 pg   MCHC 32.2 31.5 - 35.7 g/dL   RDW 27.2 53.6 - 64.4 %   Platelets 227 150 - 450 x10E3/uL   Neutrophils 61 Not Estab. %   Lymphs 29 Not Estab. %   Monocytes 7 Not Estab. %   Eos 2 Not Estab. %   Basos 1 Not Estab. %   Neutrophils Absolute 4.7 1.4 - 7.0 x10E3/uL   Lymphocytes Absolute 2.3 0.7 - 3.1 x10E3/uL   Monocytes Absolute 0.6 0.1 - 0.9 x10E3/uL   EOS (ABSOLUTE) 0.1 0.0 - 0.4 x10E3/uL   Basophils Absolute 0.0 0.0 - 0.2 x10E3/uL   Immature Granulocytes 0 Not Estab. %   Immature Grans (Abs) 0.0 0.0 - 0.1 x10E3/uL  Comprehensive metabolic panel   Collection Time: 08/29/23  9:20 AM  Result Value Ref Range   Glucose 96 70 - 99 mg/dL   BUN 9 6 - 20 mg/dL   Creatinine, Ser 0.34 0.57 - 1.00 mg/dL   eGFR 742 >59 DG/LOV/5.64   BUN/Creatinine Ratio 12 9 - 23   Sodium 139 134 - 144 mmol/L   Potassium 4.3 3.5 - 5.2 mmol/L   Chloride 104 96 - 106 mmol/L   CO2 20 20 - 29 mmol/L   Calcium 9.1 8.7 - 10.2 mg/dL   Total Protein 6.5 6.0 - 8.5 g/dL   Albumin 4.1 4.0 - 5.0 g/dL   Globulin, Total 2.4 1.5 - 4.5 g/dL   Bilirubin Total 0.2 0.0 - 1.2 mg/dL   Alkaline Phosphatase 103 44 - 121 IU/L   AST 37 0 - 40 IU/L   ALT 28 0 - 32 IU/L  Lipid Panel w/o Chol/HDL Ratio   Collection Time: 08/29/23  9:20 AM  Result Value Ref Range   Cholesterol, Total 192 100 - 199 mg/dL   Triglycerides 332 (H) 0 - 149 mg/dL   HDL 44 >95 mg/dL   VLDL Cholesterol Cal 34 5 - 40 mg/dL   LDL Chol Calc (NIH) 188 (H) 0 - 99 mg/dL  TSH   Collection Time: 08/29/23  9:20 AM  Result Value Ref Range   TSH 1.160 0.450 -  4.500 uIU/mL      Assessment & Plan:   Problem List Items Addressed This Visit       Other   Morbid obesity (HCC) - Primary   Will start her on wellbutrin to help with weight loss. Titrate up to 300mg  BID and recheck tolerance in 6  weeks. Call with any concerns. Continue to monitor.       Relevant Orders   Bayer DCA Hb A1c Waived     Follow up plan: Return in about 6 weeks (around 12/06/2023).

## 2023-10-25 NOTE — Assessment & Plan Note (Signed)
 Will start her on wellbutrin to help with weight loss. Titrate up to 300mg  BID and recheck tolerance in 6 weeks. Call with any concerns. Continue to monitor.

## 2023-10-26 LAB — BAYER DCA HB A1C WAIVED: HB A1C (BAYER DCA - WAIVED): 5.2 % (ref 4.8–5.6)

## 2023-11-16 ENCOUNTER — Other Ambulatory Visit: Payer: Self-pay | Admitting: Family Medicine

## 2023-11-18 NOTE — Telephone Encounter (Signed)
 Requested Prescriptions  Refused Prescriptions Disp Refills   buPROPion (WELLBUTRIN SR) 150 MG 12 hr tablet [Pharmacy Med Name: BUPROPION HCL SR 150 MG TABLET] 360 tablet 2    Sig: TAKE 1 TAB IN THE AM FOR 1 WEEK, THEN INCREASE TO 1 TAB TWICE A DAY FOR 1 WEEK, THEN 2 TABS IN THE AM AND 1 TAB IN THE PM FOR 1 WEEK, THEN 2 TABS TWICE A DAY     Psychiatry: Antidepressants - bupropion Failed - 11/18/2023  2:13 PM      Failed - Valid encounter within last 6 months    Recent Outpatient Visits           3 weeks ago Morbid obesity (HCC)   [facility] [place], [place], DO       Future Appointments             In 9 months Lincoln Renshaw, Jerilee Montane, DO [facility]            Passed - Cr in normal range and within 360 days    Creatinine, Ser  Date Value Ref Range Status  08/29/2023 0.77 0.57 - 1.00 mg/dL Final         Passed - AST in normal range and within 360 days    AST  Date Value Ref Range Status  08/29/2023 37 0 - 40 IU/L Final         Passed - ALT in normal range and within 360 days    ALT  Date Value Ref Range Status  08/29/2023 28 0 - 32 IU/L Final         Passed - Last BP in normal range    BP Readings from Last 1 Encounters:  10/25/23 109/72

## 2023-12-03 ENCOUNTER — Encounter: Payer: Self-pay | Admitting: Emergency Medicine

## 2023-12-03 ENCOUNTER — Ambulatory Visit
Admission: EM | Admit: 2023-12-03 | Discharge: 2023-12-03 | Disposition: A | Discharge status: Home / Self Care | Attending: Emergency Medicine | Admitting: Emergency Medicine

## 2023-12-03 DIAGNOSIS — B3731 Acute candidiasis of vulva and vagina: Secondary | ICD-10-CM | POA: Insufficient documentation

## 2023-12-03 DIAGNOSIS — R300 Dysuria: Secondary | ICD-10-CM | POA: Insufficient documentation

## 2023-12-03 LAB — URINALYSIS, W/ REFLEX TO CULTURE (INFECTION SUSPECTED)
Bilirubin Urine: NEGATIVE
Glucose, UA: NEGATIVE mg/dL
Ketones, ur: NEGATIVE mg/dL
Nitrite: NEGATIVE
Protein, ur: NEGATIVE mg/dL
Specific Gravity, Urine: <1.005 — ABNORMAL LOW (ref 1.005–1.030)
pH: 5.5 (ref 5.0–8.0)

## 2023-12-03 MED ORDER — PHENAZOPYRIDINE HCL 200 MG PO TABS: 200.0000 mg | ORAL_TABLET | Freq: Three times a day (TID) | ORAL | 0 refills | Status: DC

## 2023-12-03 MED ORDER — FLUCONAZOLE 150 MG PO TABS: 150.0000 mg | ORAL_TABLET | ORAL | 0 refills | Status: AC

## 2023-12-03 NOTE — Discharge Instructions (Addendum)
 Your urinalysis did not show any evidence of a urinary tract infection but there were yeast present under the microscope which indicates that she most likely had a vaginal yeast infection.  Your vaginal swab is pending and that will be back either later today or early tomorrow.  This will also check to see if you have bacterial vaginosis which could be contributing to your symptoms, including the vaginal spotting.  Take the Diflucan  150 mg tablets for treatment of yeast infection.  Take 1 tablet now and repeat dosing every 3 days for total of 3 doses.  I have also prescribed Pyridium which will help you with the urinary discomfort.  You may take 1 tablet every 8 hours.  It will turn your urine to very vivid red-orange.  Please return for reevaluation, or see your PCP or OB/GYN, for any continued or worsening symptoms.

## 2023-12-03 NOTE — ED Triage Notes (Signed)
 Pt presents with back pain, dysuria and urinary frequency x 3 days. She also vaginal discharge and odor x 1 week.

## 2023-12-03 NOTE — ED Provider Notes (Signed)
 [facility]    CSN: [id number] Arrival date & time: 12/03/23  0847      History   Chief Complaint Chief Complaint  Patient presents with   Back Pain   Urinary Frequency   Dysuria   vaginal odor     HPI Deborah Summers is a 23 y.o. female.   HPI  23 year old female with past medical history significant for migraine headaches presents for evaluation of genitourinary symptoms which started 1 week ago with a clear vaginal discharge that has a fishy odor.  3 days ago she developed low back pain, pain with urination, and urinary urgency and frequency.  She is also been experiencing some vaginal spotting.  She denies fever, blood in her urine, or abdominal pain.  She denies any concern for STIs.  Past Medical History:  Diagnosis Date   Cold    getting over a cold/cough   Headache    Migraine    Patient denies medical problems     Patient Active Problem List   Diagnosis Date Noted   Morbid obesity (HCC) 10/25/2023   Migraine without aura 03/25/2020    Past Surgical History:  Procedure Laterality Date   TONSILLECTOMY AND ADENOIDECTOMY Bilateral 10/28/2015   Procedure: TONSILLECTOMY AND ADENOIDECTOMY;  Surgeon: Lesly Raspberry, MD;  Location: [facility];  Service: ENT;  Laterality: Bilateral;    OB History     Gravida  0   Para  0   Term  0   Preterm  0   AB  0   Living  0      SAB  0   IAB  0   Ectopic  0   Multiple  0   Live Births  0            Home Medications    Prior to Admission medications   Medication Sig Start Date End Date Taking? Authorizing Provider  fluconazole  (DIFLUCAN ) 150 MG tablet Take 1 tablet (150 mg total) by mouth every 3 (three) days for 3 doses. 12/03/23 12/10/23 Yes Kent Pear, NP  phenazopyridine (PYRIDIUM) 200 MG tablet Take 1 tablet (200 mg total) by mouth 3 (three) times daily. 12/03/23  Yes Kent Pear, NP  buPROPion  (WELLBUTRIN  SR) 150 MG 12 hr tablet 1 tab in the AM for 1 week, then increase  to 1 tab BID for 1 week, then 2 tabs in the AM and 1 tab in the PM for 1 week, then 2 tabs BID 10/25/23   Johnson, Megan P, DO  eletriptan (RELPAX) 40 MG tablet     [provider]  escitalopram (LEXAPRO) 20 MG tablet TAKE 1 TAB ONCE DAILY AND CONTINUE 07/15/23   [provider]  Galcanezumab -gnlm (EMGALITY ) 120 MG/ML SOSY Inject 120 mg into the skin every 30 (thirty) days. 08/29/23   Terre Ferri P, DO  levonorgestrel (MIRENA) 20 MCG/DAY IUD 1 each by Intrauterine route once. Inserted 07/25/2023    [provider]  mometasone (ELOCON) 0.1 % lotion APPLY TO ITCHY EARS TWICE DAILY FOR 12 DAYS. MAY REPEAT AS NEEDED FOR ITCHY EARS 07/24/23   [provider]  ondansetron  (ZOFRAN -ODT) 4 MG disintegrating tablet  07/15/23   [provider]    Family History Family History  Problem Relation Age of Onset   Hypertension Mother    Heart disease Father    Anxiety disorder Brother    Uterine cancer Maternal Grandmother    Heart disease Maternal Grandfather    Multiple sclerosis Paternal  Grandmother    Heart disease Paternal Grandfather     Social History Social History   Tobacco Use   Smoking status: Never   Smokeless tobacco: Never  Vaping Use   Vaping status: Never Used  Substance Use Topics   Alcohol use: No   Drug use: No     Allergies   Tape   Review of Systems Review of Systems  Constitutional:  Negative for fever.  Gastrointestinal:  Negative for abdominal pain.  Genitourinary:  Positive for dysuria, frequency, urgency, vaginal bleeding, vaginal discharge and vaginal pain. Negative for hematuria.  Musculoskeletal:  Positive for back pain.     Physical Exam Triage Vital Signs ED Triage Vitals  Encounter Vitals Group     BP 12/03/23 0911 131/85     Systolic BP Percentile --      Diastolic BP Percentile --      Pulse Rate 12/03/23 0911 84     Resp 12/03/23 0911 16     Temp 12/03/23 0911 98.1 F (36.7 C)     Temp Source  12/03/23 0911 Oral     SpO2 12/03/23 0911 98 %     Weight --      Height --      Head Circumference --      Peak Flow --      Pain Score 12/03/23 0910 8     Pain Loc --      Pain Education --      Exclude from Growth Chart --    No data found.  Updated Vital Signs BP 131/85 (BP Location: Right Arm)   Pulse 84   Temp 98.1 F (36.7 C) (Oral)   Resp 16   SpO2 98%   Visual Acuity Right Eye Distance:   Left Eye Distance:   Bilateral Distance:    Right Eye Near:   Left Eye Near:    Bilateral Near:     Physical Exam Vitals and nursing note reviewed.  Constitutional:      Appearance: Normal appearance. She is not ill-appearing.  HENT:     Head: Normocephalic and atraumatic.  Cardiovascular:     Rate and Rhythm: Normal rate and regular rhythm.     Pulses: Normal pulses.     Heart sounds: Normal heart sounds. No murmur heard.    No friction rub. No gallop.  Pulmonary:     Effort: Pulmonary effort is normal.     Breath sounds: Normal breath sounds. No wheezing, rhonchi or rales.  Abdominal:     Palpations: Abdomen is soft.     Tenderness: There is abdominal tenderness. There is no right CVA tenderness, left CVA tenderness, guarding or rebound.     Comments: Mild suprapubic tenderness.  Remainder the abdomen is nontender to palpation.  No guarding or rebound.  No CVA tenderness present.  Skin:    General: Skin is warm and dry.     Capillary Refill: Capillary refill takes less than 2 seconds.     Findings: No rash.  Neurological:     General: No focal deficit present.     Mental Status: She is alert and oriented to person, place, and time.      UC Treatments / Results  Labs (all labs ordered are listed, but only abnormal results are displayed) Labs Reviewed  URINALYSIS, W/ REFLEX TO CULTURE (INFECTION SUSPECTED) - Abnormal; Notable for the following components:      Result Value   Specific Gravity, Urine <1.005 (*)  Hgb urine dipstick TRACE (*)    Leukocytes,Ua  TRACE (*)    Bacteria, UA FEW (*)    All other components within normal limits  CERVICOVAGINAL ANCILLARY ONLY    EKG   Radiology No results found.  Procedures Procedures (including critical care time)  Medications Ordered in UC Medications - No data to display  Initial Impression / Assessment and Plan / UC Course  I have reviewed the triage vital signs and the nursing notes.  Pertinent labs & imaging results that were available during my care of the patient were reviewed by me and considered in my medical decision making (see chart for details).   Patient is a pleasant, nontoxic-appearing 23 year old female presenting for evaluation genitourinary symptoms as outlined HPI above.  Her symptoms began with a clear vaginal discharge that has a fishlike odor that then turned into vaginal spotting.  Approximately 3 days ago she developed dysuria with urgency and frequency but no hematuria.  On exam she is in no acute distress and her abdomen is soft and flat with mild suprapubic tenderness.  She has no CVA tenderness present.  I will order a vaginal cytology swab to assess for the presence of BV or yeast as well as urinalysis to assess for the presence of UTI.  Urinalysis shows a low specific gravity of <1.005, trace hemoglobin with trace leukocyte esterase.  Negative for nitrates, protein, or glucose.  Reflex microscopy shows 6-10 RBCs with few bacteria and budding yeast present.  Vaginal cytology swab is pending.  Given the budding yeast in the urine I will discharge patient home on Diflucan  for treatment of her yeast infection.  150 mg tablets, 1 tablet now and repeat dosing every 3 days for total of 3 doses.   Final Clinical Impressions(s) / UC Diagnoses   Final diagnoses:  Dysuria  Vaginal yeast infection     Discharge Instructions      Your urinalysis did not show any evidence of a urinary tract infection but there were yeast present under the microscope which indicates that  she most likely had a vaginal yeast infection.  Your vaginal swab is pending and that will be back either later today or early tomorrow.  This will also check to see if you have bacterial vaginosis which could be contributing to your symptoms, including the vaginal spotting.  Take the Diflucan  150 mg tablets for treatment of yeast infection.  Take 1 tablet now and repeat dosing every 3 days for total of 3 doses.  I have also prescribed Pyridium which will help you with the urinary discomfort.  You may take 1 tablet every 8 hours.  It will turn your urine to very vivid red-orange.  Please return for reevaluation, or see your PCP or OB/GYN, for any continued or worsening symptoms.       ED Prescriptions     Medication Sig Dispense Auth. Provider   fluconazole  (DIFLUCAN ) 150 MG tablet Take 1 tablet (150 mg total) by mouth every 3 (three) days for 3 doses. 3 tablet Kent Pear, NP   phenazopyridine (PYRIDIUM) 200 MG tablet Take 1 tablet (200 mg total) by mouth 3 (three) times daily. 6 tablet Kent Pear, NP      PDMP not reviewed this encounter.   Kent Pear, NP 12/03/23 [phone]

## 2023-12-04 ENCOUNTER — Telehealth (HOSPITAL_COMMUNITY): Payer: Self-pay

## 2023-12-04 LAB — CERVICOVAGINAL ANCILLARY ONLY
Bacterial Vaginitis (gardnerella): POSITIVE — AB
Candida Glabrata: NEGATIVE
Candida Vaginitis: NEGATIVE
Comment: NEGATIVE
Comment: NEGATIVE
Comment: NEGATIVE

## 2023-12-04 MED ORDER — METRONIDAZOLE 500 MG PO TABS: 500.0000 mg | ORAL_TABLET | Freq: Two times a day (BID) | ORAL | 0 refills | Status: AC

## 2023-12-04 NOTE — Telephone Encounter (Signed)
 Per protocol, pt requires tx with metronidazole. Rx sent to pharmacy on file.

## 2023-12-06 ENCOUNTER — Ambulatory Visit: Admitting: Family Medicine

## 2023-12-06 ENCOUNTER — Encounter: Payer: Self-pay | Admitting: Family Medicine

## 2023-12-06 DIAGNOSIS — R8281 Pyuria: Secondary | ICD-10-CM | POA: Diagnosis not present

## 2023-12-06 DIAGNOSIS — R300 Dysuria: Secondary | ICD-10-CM

## 2023-12-06 LAB — URINALYSIS, ROUTINE W REFLEX MICROSCOPIC
Bilirubin, UA: NEGATIVE
Glucose, UA: NEGATIVE
Ketones, UA: NEGATIVE
Nitrite, UA: NEGATIVE
Protein,UA: NEGATIVE
Specific Gravity, UA: 1.025 (ref 1.005–1.030)
Urobilinogen, Ur: 0.2 mg/dL (ref 0.2–1.0)
pH, UA: 6 (ref 5.0–7.5)

## 2023-12-06 LAB — MICROSCOPIC EXAMINATION: Bacteria, UA: NONE SEEN

## 2023-12-06 MED ORDER — BUPROPION HCL ER (SR) 150 MG PO TB12: 300.0000 mg | ORAL_TABLET | Freq: Two times a day (BID) | ORAL | 1 refills | Status: DC

## 2023-12-06 NOTE — Assessment & Plan Note (Signed)
 Congratulated patient on 10lb weight loss! Continue wellbutrin . Continue to monitor. Call with any concerns.

## 2023-12-06 NOTE — Progress Notes (Signed)
 BP 111/78 (BP Location: Left Arm, Patient Position: Sitting, Cuff Size: Normal)   Pulse 87   Ht 5\' 9"  (1.753 m)   Wt 263 lb (119.3 kg)   SpO2 97%   BMI 38.84 kg/m    Subjective:    Patient ID: Deborah Summers, female    DOB: 09/18/2000, 23 y.o.   MRN: [id number]  HPI: Deborah M Bouton is a 23 y.o. female  Chief Complaint  Patient presents with   Obesity   OBESITY Duration: chronic Previous attempts at weight loss: yes, has been working out 6 days a week, high protein low calorie Complications of obesity: none Peak weight: 273lbs Weight loss goal: 200 Weight loss to date: 10lbs Requesting obesity pharmacotherapy: yes Current weight loss supplements/medications: yes Previous weight loss supplements/meds: no   Relevant past medical, surgical, family and social history reviewed and updated as indicated. Interim medical history since our last visit reviewed. Allergies and medications reviewed and updated.  Review of Systems  Constitutional: Negative.   Respiratory: Negative.    Cardiovascular: Negative.   Genitourinary:  Positive for vaginal bleeding. Negative for decreased urine volume, difficulty urinating, dyspareunia, dysuria, enuresis, flank pain, frequency, genital sores, hematuria, menstrual problem, pelvic pain, urgency, vaginal discharge and vaginal pain.  Musculoskeletal: Negative.   Neurological: Negative.   Psychiatric/Behavioral: Negative.      Per HPI unless specifically indicated above     Objective:    BP 111/78 (BP Location: Left Arm, Patient Position: Sitting, Cuff Size: Normal)   Pulse 87   Ht 5\' 9"  (1.753 m)   Wt 263 lb (119.3 kg)   SpO2 97%   BMI 38.84 kg/m   Wt Readings from Last 3 Encounters:  12/06/23 263 lb (119.3 kg)  10/25/23 272 lb 9.6 oz (123.7 kg)  08/29/23 273 lb 3.2 oz (123.9 kg)    Physical Exam Vitals and nursing note reviewed.  Constitutional:      General: She is not in acute distress.    Appearance: Normal appearance. She  is obese. She is not ill-appearing, toxic-appearing or diaphoretic.  HENT:     Head: Normocephalic and atraumatic.     Right Ear: External ear normal.     Left Ear: External ear normal.     Nose: Nose normal.     Mouth/Throat:     Mouth: Mucous membranes are moist.     Pharynx: Oropharynx is clear.  Eyes:     General: No scleral icterus.       Right eye: No discharge.        Left eye: No discharge.     Extraocular Movements: Extraocular movements intact.     Conjunctiva/sclera: Conjunctivae normal.     Pupils: Pupils are equal, round, and reactive to light.  Cardiovascular:     Rate and Rhythm: Normal rate and regular rhythm.     Pulses: Normal pulses.     Heart sounds: Normal heart sounds. No murmur heard.    No friction rub. No gallop.  Pulmonary:     Effort: Pulmonary effort is normal. No respiratory distress.     Breath sounds: Normal breath sounds. No stridor. No wheezing, rhonchi or rales.  Chest:     Chest wall: No tenderness.  Musculoskeletal:        General: Normal range of motion.     Cervical back: Normal range of motion and neck supple.  Skin:    General: Skin is warm and dry.     Capillary Refill: Capillary refill  takes less than 2 seconds.     Coloration: Skin is not jaundiced or pale.     Findings: No bruising, erythema, lesion or rash.  Neurological:     General: No focal deficit present.     Mental Status: She is alert and oriented to person, place, and time. Mental status is at baseline.  Psychiatric:        Mood and Affect: Mood normal.        Behavior: Behavior normal.        Thought Content: Thought content normal.        Judgment: Judgment normal.     Results for orders placed or performed during the hospital encounter of 12/03/23  Urinalysis, w/ Reflex to Culture (Infection Suspected) -Urine, Clean Catch   Collection Time: 12/03/23  9:13 AM  Result Value Ref Range   Specimen Source URINE, CLEAN CATCH    Color, Urine YELLOW YELLOW   APPearance  CLEAR CLEAR   Specific Gravity, Urine <1.005 (L) 1.005 - 1.030   pH 5.5 5.0 - 8.0   Glucose, UA NEGATIVE NEGATIVE mg/dL   Hgb urine dipstick TRACE (A) NEGATIVE   Bilirubin Urine NEGATIVE NEGATIVE   Ketones, ur NEGATIVE NEGATIVE mg/dL   Protein, ur NEGATIVE NEGATIVE mg/dL   Nitrite NEGATIVE NEGATIVE   Leukocytes,Ua TRACE (A) NEGATIVE   Squamous Epithelial / HPF 0-5 0 - 5 /HPF   WBC, UA 0-5 0 - 5 WBC/hpf   RBC / HPF 6-10 0 - 5 RBC/hpf   Bacteria, UA FEW (A) NONE SEEN   Budding Yeast PRESENT   Cervicovaginal ancillary only   Collection Time: 12/03/23  9:18 AM  Result Value Ref Range   Bacterial Vaginitis (gardnerella) Positive (A)    Candida Vaginitis Negative    Candida Glabrata Negative    Comment      Normal Reference Range Bacterial Vaginosis - Negative   Comment Normal Reference Range Candida Species - Negative    Comment Normal Reference Range Candida Galbrata - Negative       Assessment & Plan:   Problem List Items Addressed This Visit       Other   Morbid obesity (HCC) - Primary   Congratulated patient on 10lb weight loss! Continue wellbutrin . Continue to monitor. Call with any concerns.       Other Visit Diagnoses       Dysuria       On flagyl  for BV, but will send for culture. Await results.   Relevant Orders   Urinalysis, Routine w reflex microscopic     Pyuria       On flagyl  for BV, but will send for culture. Await results.   Relevant Orders   Urine Culture        Follow up plan: Return in about 3 months (around 03/07/2024).

## 2023-12-08 LAB — URINE CULTURE

## 2024-03-23 ENCOUNTER — Ambulatory Visit: Admitting: Family Medicine

## 2024-03-31 ENCOUNTER — Ambulatory Visit: Admitting: Family Medicine

## 2024-03-31 ENCOUNTER — Encounter: Payer: Self-pay | Admitting: Family Medicine

## 2024-03-31 MED ORDER — TRIAMCINOLONE ACETONIDE 0.5 % EX OINT
1.0000 | TOPICAL_OINTMENT | Freq: Two times a day (BID) | CUTANEOUS | 0 refills | Status: DC
Start: 2024-03-31 — End: 2024-06-14

## 2024-03-31 NOTE — Progress Notes (Signed)
 BP 109/77   Pulse 93   Temp 98 F (36.7 C) (Oral)   Ht 5' 9 (1.753 m)   Wt 252 lb 8 oz (114.5 kg)   SpO2 97%   BMI 37.29 kg/m    Subjective:    Patient ID: Deborah Summers, female    DOB: 2001-07-05, 23 y.o.   MRN: [id number]  HPI: Deborah Summers is a 23 y.o. female  Chief Complaint  Patient presents with   Obesity   OBESITY Duration: chronic Previous attempts at weight loss: yes, has been working out 6 days a week, high protein low calorie Complications of obesity: none Peak weight: 273lbs Weight loss goal: 200 Weight loss to date: 21lbs Requesting obesity pharmacotherapy: yes Current weight loss supplements/medications: yes Previous weight loss supplements/meds: no  Relevant past medical, surgical, family and social history reviewed and updated as indicated. Interim medical history since our last visit reviewed. Allergies and medications reviewed and updated.  Review of Systems  Constitutional: Negative.   Respiratory: Negative.    Cardiovascular: Negative.   Musculoskeletal: Negative.   Skin: Negative.   Psychiatric/Behavioral: Negative.      Per HPI unless specifically indicated above     Objective:    BP 109/77   Pulse 93   Temp 98 F (36.7 C) (Oral)   Ht 5' 9 (1.753 m)   Wt 252 lb 8 oz (114.5 kg)   SpO2 97%   BMI 37.29 kg/m   Wt Readings from Last 3 Encounters:  03/31/24 252 lb 8 oz (114.5 kg)  12/06/23 263 lb (119.3 kg)  10/25/23 272 lb 9.6 oz (123.7 kg)    Physical Exam Vitals and nursing note reviewed.  Constitutional:      General: She is not in acute distress.    Appearance: Normal appearance. She is not ill-appearing, toxic-appearing or diaphoretic.  HENT:     Head: Normocephalic and atraumatic.     Right Ear: External ear normal.     Left Ear: External ear normal.     Nose: Nose normal.     Mouth/Throat:     Mouth: Mucous membranes are moist.     Pharynx: Oropharynx is clear.  Eyes:     General: No scleral icterus.        Right eye: No discharge.        Left eye: No discharge.     Extraocular Movements: Extraocular movements intact.     Conjunctiva/sclera: Conjunctivae normal.     Pupils: Pupils are equal, round, and reactive to light.  Cardiovascular:     Rate and Rhythm: Normal rate and regular rhythm.     Pulses: Normal pulses.     Heart sounds: Normal heart sounds. No murmur heard.    No friction rub. No gallop.  Pulmonary:     Effort: Pulmonary effort is normal. No respiratory distress.     Breath sounds: Normal breath sounds. No stridor. No wheezing, rhonchi or rales.  Chest:     Chest wall: No tenderness.  Musculoskeletal:        General: Normal range of motion.     Cervical back: Normal range of motion and neck supple.  Skin:    General: Skin is warm and dry.     Capillary Refill: Capillary refill takes less than 2 seconds.     Coloration: Skin is not jaundiced or pale.     Findings: No bruising, erythema, lesion or rash.  Neurological:     General: No focal deficit  present.     Mental Status: She is alert and oriented to person, place, and time. Mental status is at baseline.  Psychiatric:        Mood and Affect: Mood normal.        Behavior: Behavior normal.        Thought Content: Thought content normal.        Judgment: Judgment normal.     Results for orders placed or performed in visit on 12/06/23  Microscopic Examination   Collection Time: 12/06/23 11:10 AM   Urine  Result Value Ref Range   WBC, UA 0-5 0 - 5 /hpf   RBC, Urine 0-2 0 - 2 /hpf   Epithelial Cells (non renal) 0-10 0 - 10 /hpf   Bacteria, UA None seen None seen/Few  Urinalysis, Routine w reflex microscopic   Collection Time: 12/06/23 11:10 AM  Result Value Ref Range   Specific Gravity, UA 1.025 1.005 - 1.030   pH, UA 6.0 5.0 - 7.5   Color, UA Yellow Yellow   Appearance Ur Cloudy (A) Clear   Leukocytes,UA 1+ (A) Negative   Protein,UA Negative Negative/Trace   Glucose, UA Negative Negative   Ketones, UA  Negative Negative   RBC, UA Trace (A) Negative   Bilirubin, UA Negative Negative   Urobilinogen, Ur 0.2 0.2 - 1.0 mg/dL   Nitrite, UA Negative Negative   Microscopic Examination See below:   Urine Culture   Collection Time: 12/06/23 11:22 AM   Specimen: Urine   UR  Result Value Ref Range   Urine Culture, Routine Final report    Organism ID, Bacteria Comment       Assessment & Plan:   Problem List Items Addressed This Visit       Other   Morbid obesity (HCC) - Primary   Congratulated patient on 21lb weight loss! Tolerating wellbutrin  well. Continue to monitor. Call with any concerns. Follow up at physical in January.         Follow up plan: Return in about 5 months (around 08/31/2024) for physical.

## 2024-03-31 NOTE — Assessment & Plan Note (Addendum)
 Congratulated patient on 21lb weight loss! Tolerating wellbutrin  well. Continue to monitor. Call with any concerns. Follow up at physical in January.

## 2024-05-07 ENCOUNTER — Ambulatory Visit: Admitting: Family Medicine

## 2024-06-14 ENCOUNTER — Emergency Department (HOSPITAL_COMMUNITY)
Admission: EM | Admit: 2024-06-14 | Discharge: 2024-06-15 | Disposition: A | Attending: Emergency Medicine | Admitting: Emergency Medicine

## 2024-06-14 ENCOUNTER — Other Ambulatory Visit: Payer: Self-pay

## 2024-06-14 ENCOUNTER — Ambulatory Visit
Admission: EM | Admit: 2024-06-14 | Discharge: 2024-06-14 | Disposition: A | Discharge status: Home / Self Care | Attending: Family Medicine | Admitting: Family Medicine

## 2024-06-14 ENCOUNTER — Encounter (HOSPITAL_COMMUNITY): Payer: Self-pay

## 2024-06-14 VITALS — BP 146/88 | HR 92 | Temp 99.3°F | Resp 16

## 2024-06-14 DIAGNOSIS — L02419 Cutaneous abscess of limb, unspecified: Secondary | ICD-10-CM

## 2024-06-14 DIAGNOSIS — L03116 Cellulitis of left lower limb: Secondary | ICD-10-CM | POA: Diagnosis not present

## 2024-06-14 DIAGNOSIS — D72829 Elevated white blood cell count, unspecified: Secondary | ICD-10-CM | POA: Insufficient documentation

## 2024-06-14 DIAGNOSIS — L03818 Cellulitis of other sites: Secondary | ICD-10-CM

## 2024-06-14 DIAGNOSIS — M7989 Other specified soft tissue disorders: Secondary | ICD-10-CM | POA: Diagnosis present

## 2024-06-14 DIAGNOSIS — L03119 Cellulitis of unspecified part of limb: Secondary | ICD-10-CM | POA: Diagnosis not present

## 2024-06-14 LAB — COMPREHENSIVE METABOLIC PANEL WITH GFR
ALT: 17 U/L (ref 0–44)
AST: 15 U/L (ref 15–41)
Albumin: 3.5 g/dL (ref 3.5–5.0)
Alkaline Phosphatase: 86 U/L (ref 38–126)
Anion gap: 13 (ref 5–15)
BUN: 9 mg/dL (ref 6–20)
CO2: 21 mmol/L — ABNORMAL LOW (ref 22–32)
Calcium: 8.8 mg/dL — ABNORMAL LOW (ref 8.9–10.3)
Chloride: 101 mmol/L (ref 98–111)
Creatinine, Ser: 0.94 mg/dL (ref 0.44–1.00)
GFR, Estimated: >60 mL/min (ref >60)
Glucose, Bld: 100 mg/dL — ABNORMAL HIGH (ref 70–99)
Potassium: 3.5 mmol/L (ref 3.5–5.1)
Sodium: 135 mmol/L (ref 135–145)
Total Bilirubin: 0.5 mg/dL (ref 0.0–1.2)
Total Protein: 6.9 g/dL (ref 6.5–8.1)

## 2024-06-14 LAB — URINALYSIS, W/ REFLEX TO CULTURE (INFECTION SUSPECTED)
Bilirubin Urine: NEGATIVE
Glucose, UA: NEGATIVE mg/dL
Hgb urine dipstick: NEGATIVE
Ketones, ur: NEGATIVE mg/dL
Leukocytes,Ua: NEGATIVE
Nitrite: NEGATIVE
Protein, ur: NEGATIVE mg/dL
Specific Gravity, Urine: 1.021 (ref 1.005–1.030)
pH: 5 (ref 5.0–8.0)

## 2024-06-14 LAB — CBC WITH DIFFERENTIAL/PLATELET
Abs Immature Granulocytes: 0.04 K/uL (ref 0.00–0.07)
Basophils Absolute: 0 K/uL (ref 0.0–0.1)
Basophils Relative: 0 %
Eosinophils Absolute: 0.1 K/uL (ref 0.0–0.5)
Eosinophils Relative: 1 %
HCT: 41.8 % (ref 36.0–46.0)
Hemoglobin: 14.3 g/dL (ref 12.0–15.0)
Immature Granulocytes: 0 %
Lymphocytes Relative: 20 %
Lymphs Abs: 2.6 K/uL (ref 0.7–4.0)
MCH: 29.3 pg (ref 26.0–34.0)
MCHC: 34.2 g/dL (ref 30.0–36.0)
MCV: 85.7 fL (ref 80.0–100.0)
Monocytes Absolute: 0.9 K/uL (ref 0.1–1.0)
Monocytes Relative: 7 %
Neutro Abs: 9.4 K/uL — ABNORMAL HIGH (ref 1.7–7.7)
Neutrophils Relative %: 72 %
Platelets: 223 K/uL (ref 150–400)
RBC: 4.88 MIL/uL (ref 3.87–5.11)
RDW: 12.4 % (ref 11.5–15.5)
WBC: 13.1 K/uL — ABNORMAL HIGH (ref 4.0–10.5)
nRBC: 0 % (ref 0.0–0.2)

## 2024-06-14 LAB — I-STAT CG4 LACTIC ACID, ED: Lactic Acid, Venous: 0.8 mmol/L (ref 0.5–1.9)

## 2024-06-14 LAB — HCG, SERUM, QUALITATIVE: Preg, Serum: NEGATIVE

## 2024-06-14 MED ORDER — SULFAMETHOXAZOLE-TRIMETHOPRIM 800-160 MG PO TABS: 1.0000 | ORAL_TABLET | Freq: Two times a day (BID) | ORAL | 0 refills | Status: DC

## 2024-06-14 MED ORDER — CEFTRIAXONE SODIUM 1 G IJ SOLR
1000.0000 mg | Freq: Once | INTRAMUSCULAR | Status: AC
Start: 2024-06-14 — End: 2024-06-14
  Administered 2024-06-14: 1000 mg via INTRAMUSCULAR

## 2024-06-14 MED ORDER — TRAMADOL HCL 50 MG PO TABS: 50.0000 mg | ORAL_TABLET | Freq: Three times a day (TID) | ORAL | 0 refills | Status: AC | PRN

## 2024-06-14 NOTE — Discharge Instructions (Addendum)
 I have prescribed sulfamethoxazole-trimethoprim antibiotic.  This is good antibiotic coverage including MRSA.  Take 2 pills tonight with food.  Starting tomorrow take 1 pill 2 times a day Take Tylenol  or over-the-counter medicines for moderate pain Take tramadol if pain is severe Warm compresses for 20 minutes every couple of hours Go to the ER if you are worse instead of better at any time

## 2024-06-14 NOTE — ED Triage Notes (Signed)
 C/O left proximal medial thigh abscess onset 2 days ago. No known fevers. Has been applying heat. Has taken Tyl.

## 2024-06-14 NOTE — ED Triage Notes (Signed)
 Pt states that on Friday she noticed a small red bump on the inner aspect of her L thigh. Pt states that she went to UC and attempted to have needle aspiration earlier today but has had significant change in size, temp, and pain since UC visit. Pt also reports tactile fevers, chills. Last took tylenol  at 1900 today.

## 2024-06-14 NOTE — ED Provider Notes (Signed)
 [facility]    CSN: [id number] Arrival date & time: 06/14/24  1327      History   Chief Complaint Chief Complaint  Patient presents with   Abscess    HPI Deborah Summers is a 23 y.o. female.   Deborah Summers works as an [employer] at [employer].  She had a small bump on the inside of her left thigh a couple days ago.  She thought it was just a pimple.  She states that she wore some new scrubs that rubbed on the area.  It has developed into a very large red painful area.  She thinks there is an abscess.  She has had some chills and fever.  She states she does not have a history of abscesses or skin infection.  She does have sensitive skin.  She is on Emgality  for migraine condition    Past Medical History:  Diagnosis Date   Cold    getting over a cold/cough   Headache    Migraine    Patient denies medical problems     Patient Active Problem List   Diagnosis Date Noted   Morbid obesity (HCC) 10/25/2023   Migraine without aura 03/25/2020    Past Surgical History:  Procedure Laterality Date   TONSILLECTOMY AND ADENOIDECTOMY Bilateral 10/28/2015   Procedure: TONSILLECTOMY AND ADENOIDECTOMY;  Surgeon: Chinita Hasten, MD;  Location: [facility];  Service: ENT;  Laterality: Bilateral;    OB History     Gravida  0   Para  0   Term  0   Preterm  0   AB  0   Living  0      SAB  0   IAB  0   Ectopic  0   Multiple  0   Live Births  0            Home Medications    Prior to Admission medications   Medication Sig Start Date End Date Taking? Authorizing Provider  buPROPion  (WELLBUTRIN  SR) 150 MG 12 hr tablet Take 2 tablets (300 mg total) by mouth 2 (two) times daily. 12/06/23  Yes Johnson, Megan P, DO  eletriptan (RELPAX) 40 MG tablet    Yes [provider]  Galcanezumab -gnlm (EMGALITY ) 120 MG/ML SOSY Inject 120 mg into the skin every 30 (thirty) days. 08/29/23  Yes Johnson, Megan P, DO  levonorgestrel (MIRENA) 20 MCG/DAY IUD 1 each by  Intrauterine route once. Inserted 07/25/2023   Yes [provider]  sulfamethoxazole-trimethoprim (BACTRIM DS) 800-160 MG tablet Take 1 tablet by mouth 2 (two) times daily for 10 days. 06/14/24 06/24/24 Yes Maranda Jamee Jacob, MD  traMADol (ULTRAM) 50 MG tablet Take 1-2 tablets (50-100 mg total) by mouth 3 (three) times daily as needed. 06/14/24  Yes Maranda Jamee Jacob, MD  ondansetron  (ZOFRAN -ODT) 4 MG disintegrating tablet  07/15/23   [provider]    Family History Family History  Problem Relation Age of Onset   Hypertension Mother    Non-Hodgkin's lymphoma Mother    Heart disease Father    Anxiety disorder Brother    Uterine cancer Maternal Grandmother    Heart disease Maternal Grandfather    Multiple sclerosis Paternal Grandmother    Heart disease Paternal Grandfather     Social History Social History   Tobacco Use   Smoking status: Never   Smokeless tobacco: Never  Vaping Use   Vaping status: Never Used  Substance Use Topics   Alcohol use: No  Drug use: No     Allergies   Tape   Review of Systems Review of Systems See HPI  Physical Exam Triage Vital Signs ED Triage Vitals  Encounter Vitals Group     BP 06/14/24 1350 (!) 146/88     Girls Systolic BP Percentile --      Girls Diastolic BP Percentile --      Boys Systolic BP Percentile --      Boys Diastolic BP Percentile --      Pulse Rate 06/14/24 1350 92     Resp 06/14/24 1350 16     Temp 06/14/24 1350 99.3 F (37.4 C)     Temp Source 06/14/24 1350 Oral     SpO2 06/14/24 1350 96 %     Weight --      Height --      Head Circumference --      Peak Flow --      Pain Score 06/14/24 1351 7     Pain Loc --      Pain Education --      Exclude from Growth Chart --    No data found.  Updated Vital Signs BP (!) 146/88   Pulse 92   Temp 99.3 F (37.4 C) (Oral)   Resp 16   SpO2 96%      Physical Exam Constitutional:      General: She is in acute distress.     Appearance: She is  well-developed. She is obese.  HENT:     Head: Normocephalic and atraumatic.  Eyes:     Conjunctiva/sclera: Conjunctivae normal.     Pupils: Pupils are equal, round, and reactive to light.  Cardiovascular:     Rate and Rhythm: Normal rate.  Pulmonary:     Effort: Pulmonary effort is normal. No respiratory distress.  Abdominal:     General: There is no distension.     Palpations: Abdomen is soft.  Musculoskeletal:        General: Normal range of motion.     Cervical back: Normal range of motion.  Skin:    General: Skin is warm and dry.     Findings: Erythema present.     Comments: Inside the right thigh in the upper thigh region there is a 2 cm ecchymotic area surrounded by 15 to 20 cm of erythema.  In the center there is a 4 cm indurated region.  The area is very painful and tender to touch  Neurological:     Mental Status: She is alert.      UC Treatments / Results  Labs (all labs ordered are listed, but only abnormal results are displayed) Labs Reviewed - No data to display  EKG   Radiology No results found.  Procedures With patient's permission the area is cleansed with Betadine, and an anesthetic wheal with lidocaine  was placed.  2 cc of 1% lidocaine .  I then aspirated with an 18-gauge needle, 1-1/2 inches into the center of the induration.  No purulence returned  Medications Ordered in UC Medications  cefTRIAXone (ROCEPHIN) injection 1,000 mg (1,000 mg Intramuscular Given 06/14/24 1426)    Initial Impression / Assessment and Plan / UC Course  I have reviewed the triage vital signs and the nursing notes.  Pertinent labs & imaging results that were available during my care of the patient were reviewed by me and considered in my medical decision making (see chart for details).     Patient has a large area of  cellulitis with fever.  Explained to her that if she gets worse instead of better in a time she may need IV antibiotics.  She received a gram of Rocephin and  then sulfa antibiotics to cover MRSA.  Follow-up with PCP next week Final Clinical Impressions(s) / UC Diagnoses   Final diagnoses:  Cellulitis and abscess of leg     Discharge Instructions      I have prescribed sulfamethoxazole-trimethoprim antibiotic.  This is good antibiotic coverage including MRSA.  Take 2 pills tonight with food.  Starting tomorrow take 1 pill 2 times a day Take Tylenol  or over-the-counter medicines for moderate pain Take tramadol if pain is severe Warm compresses for 20 minutes every couple of hours Go to the ER if you are worse instead of better at any time   ED Prescriptions     Medication Sig Dispense Auth. Provider   sulfamethoxazole-trimethoprim (BACTRIM DS) 800-160 MG tablet Take 1 tablet by mouth 2 (two) times daily for 10 days. 20 tablet Maranda Jamee Jacob, MD   traMADol (ULTRAM) 50 MG tablet Take 1-2 tablets (50-100 mg total) by mouth 3 (three) times daily as needed. 15 tablet Maranda Jamee Jacob, MD      I have reviewed the PDMP during this encounter.   Maranda Jamee Jacob, MD 06/14/24 [phone]

## 2024-06-15 ENCOUNTER — Encounter: Payer: Self-pay | Admitting: Family Medicine

## 2024-06-15 NOTE — Discharge Instructions (Signed)
 You have a skin infection called cellulitis; there is no abscess on your leg today.  Continue the antibiotics as previously prescribed.  Use ibuprofen  and Tylenol  as needed for your discomfort, follow-up with your primary care doctor and return to the ER with any new severe symptoms.

## 2024-06-15 NOTE — ED Provider Notes (Signed)
 [place] EMERGENCY DEPARTMENT AT [facility] Provider Note   CSN: [id number] Arrival date & time: 06/14/24  2208     Patient presents with: Abscess   Deborah Summers is a 23 y.o. female who is an [employer].  She presents with 3 days of redness to the left medial thigh.  She did not initially started as a small bump in the thigh that she feels was excoriated by her scrub pants when she was walking at work during a shift.  Subsequently developed expanding redness, swelling to the area and worsening pain.  Was seen in urgent care today and initiated on Bactrim which she has had 1 dose of but presented to the ED due to concern for spreading redness.  Provider did attempt needle aspiration urgent care without success.  No immunocompromising condition, does have a history of migraines with monthly Emgality  injections.   HPI     Prior to Admission medications   Medication Sig Start Date End Date Taking? Authorizing Provider  buPROPion  (WELLBUTRIN  SR) 150 MG 12 hr tablet Take 2 tablets (300 mg total) by mouth 2 (two) times daily. 12/06/23   Johnson, Megan P, DO  eletriptan (RELPAX) 40 MG tablet     [provider]  Galcanezumab -gnlm (EMGALITY ) 120 MG/ML SOSY Inject 120 mg into the skin every 30 (thirty) days. 08/29/23   Deborah Bouchard P, DO  levonorgestrel (MIRENA) 20 MCG/DAY IUD 1 each by Intrauterine route once. Inserted 07/25/2023    [provider]  ondansetron  (ZOFRAN -ODT) 4 MG disintegrating tablet  07/15/23   [provider]  sulfamethoxazole-trimethoprim (BACTRIM DS) 800-160 MG tablet Take 1 tablet by mouth 2 (two) times daily for 10 days. 06/14/24 06/24/24  Deborah Jamee Jacob, MD  traMADol (ULTRAM) 50 MG tablet Take 1-2 tablets (50-100 mg total) by mouth 3 (three) times daily as needed. 06/14/24   Deborah Jamee Jacob, MD    Allergies: Tape    Review of Systems  Genitourinary: Negative.   Skin:  Positive for rash.    Updated Vital Signs BP (!) 117/99    Pulse 95   Temp 98.7 F (37.1 C)   Resp 16   Ht 5' 9 (1.753 m)   Wt 113.4 kg   SpO2 100%   BMI 36.92 kg/m   Physical Exam Vitals and nursing note reviewed.  Constitutional:      Appearance: She is not ill-appearing or toxic-appearing.  HENT:     Head: Normocephalic and atraumatic.     Mouth/Throat:     Mouth: Mucous membranes are moist.     Pharynx: No oropharyngeal exudate or posterior oropharyngeal erythema.  Eyes:     General:        Right eye: No discharge.        Left eye: No discharge.     Conjunctiva/sclera: Conjunctivae normal.  Cardiovascular:     Rate and Rhythm: Normal rate and regular rhythm.     Pulses: Normal pulses.     Heart sounds: Normal heart sounds. No murmur heard. Pulmonary:     Effort: Pulmonary effort is normal. No respiratory distress.     Breath sounds: Normal breath sounds. No wheezing or rales.  Abdominal:     General: Bowel sounds are normal. There is no distension.     Palpations: Abdomen is soft.     Tenderness: There is no abdominal tenderness. There is no left CVA tenderness, guarding or rebound.  Musculoskeletal:        General: No deformity.  Cervical back: Neck supple.       Legs:  Skin:    General: Skin is warm and dry.     Capillary Refill: Capillary refill takes less than 2 seconds.  Neurological:     General: No focal deficit present.     Mental Status: She is alert and oriented to person, place, and time. Mental status is at baseline.  Psychiatric:        Mood and Affect: Mood normal.     (all labs ordered are listed, but only abnormal results are displayed) Labs Reviewed  COMPREHENSIVE METABOLIC PANEL WITH GFR - Abnormal; Notable for the following components:      Result Value   CO2 21 (*)    Glucose, Bld 100 (*)    Calcium 8.8 (*)    All other components within normal limits  CBC WITH DIFFERENTIAL/PLATELET - Abnormal; Notable for the following components:   WBC 13.1 (*)    Neutro Abs 9.4 (*)    All other  components within normal limits  URINALYSIS, W/ REFLEX TO CULTURE (INFECTION SUSPECTED) - Abnormal; Notable for the following components:   APPearance HAZY (*)    Bacteria, UA RARE (*)    All other components within normal limits  HCG, SERUM, QUALITATIVE  I-STAT CG4 LACTIC ACID, ED    EKG: None  Radiology: No results found.   SABRAUltrasound ED Soft Tissue  Date/Time: 06/15/2024 4:52 AM  Performed by: Deborah Pleasant SAUNDERS, PA-C Authorized by: Deborah Pleasant SAUNDERS, PA-C   Procedure details:    Indications: localization of abscess and evaluate for cellulitis     Transverse view:  Visualized   Longitudinal view:  Visualized   Images: not archived   Location:    Location: lower extremity     Side:  Left Findings:     no abscess present    cellulitis present    no foreign body present    Medications Ordered in the ED - No data to display                                  Medical Decision Making 23 y/o female who presents with concern for rash to the L thigh.   Tachy and HTN on intake, cardiopulmonary exam unremarkable, abdominal exam is benign. Skin exam as above. No involvement of the external genitalia or inguinal fold.  No crepitus.  DDx includes limited to contact dermatitis, cellulitis, abscess, folliculitis, herpes zoster, Fournier's gangrene.  Amount and/or Complexity of Data Reviewed Labs: ordered.    Details: CBC with leukocytosis of 13, CMP unremarkable, hCG negative, lactic unremarkable, UA unremarkable.   Clinical patient was consistent with cellulitis as previously diagnosed.  No evidence of abscess on bedside ultrasound.  Site was marked with marking pen by this provider, patient has had less than 24 hours of outpatient antibiotics therefore would not consider having failed oral antibiotics at this time.  Strict return precautions were given, recommend close outpatient follow-up with her PCP.  Hot compresses encouraged should she develop any drainage from site  of previous attempted needle aspiration in the urgent care center.  Clinical concern for emergent underlying condition or further ED workup and patient management is exceedingly low.  Deborah Summers voiced understanding of her medical evaluation and treatment plan. Each of their questions answered to their expressed satisfaction.  Return precautions were given.  Patient is well-appearing, stable, and was discharged in good condition.  This  chart was dictated using voice recognition software, Dragon. Despite the best efforts of this provider to proofread and correct errors, errors may still occur which can change documentation meaning.      Final diagnoses:  Cellulitis of other specified site    ED Discharge Orders     None          Deborah Pleasant SAUNDERS, PA-C 06/15/24 0454    Midge Golas, MD 06/15/24 [phone]

## 2024-06-15 NOTE — Telephone Encounter (Signed)
 Called patient and left a message for her to call back to get scheduled for next week.

## 2024-06-15 NOTE — Telephone Encounter (Signed)
 Can we please put her in a same day with me next week? OK to double book if needed

## 2024-06-16 ENCOUNTER — Other Ambulatory Visit: Payer: Self-pay

## 2024-06-16 ENCOUNTER — Encounter (HOSPITAL_COMMUNITY): Payer: Self-pay

## 2024-06-16 ENCOUNTER — Emergency Department (HOSPITAL_COMMUNITY): Admission: EM | Admit: 2024-06-16 | Discharge: 2024-06-16 | Disposition: A | Discharge status: Home / Self Care

## 2024-06-16 DIAGNOSIS — L02416 Cutaneous abscess of left lower limb: Secondary | ICD-10-CM | POA: Diagnosis present

## 2024-06-16 DIAGNOSIS — L03116 Cellulitis of left lower limb: Secondary | ICD-10-CM | POA: Insufficient documentation

## 2024-06-16 LAB — COMPREHENSIVE METABOLIC PANEL WITH GFR
ALT: 11 U/L (ref 0–44)
AST: 19 U/L (ref 15–41)
Albumin: 4.2 g/dL (ref 3.5–5.0)
Alkaline Phosphatase: 111 U/L (ref 38–126)
Anion gap: 11 (ref 5–15)
BUN: 10 mg/dL (ref 6–20)
CO2: 22 mmol/L (ref 22–32)
Calcium: 9.6 mg/dL (ref 8.9–10.3)
Chloride: 107 mmol/L (ref 98–111)
Creatinine, Ser: 1.1 mg/dL — ABNORMAL HIGH (ref 0.44–1.00)
GFR, Estimated: >60 mL/min (ref >60)
Glucose, Bld: 79 mg/dL (ref 70–99)
Potassium: 3.8 mmol/L (ref 3.5–5.1)
Sodium: 140 mmol/L (ref 135–145)
Total Bilirubin: 0.4 mg/dL (ref 0.0–1.2)
Total Protein: 7.8 g/dL (ref 6.5–8.1)

## 2024-06-16 LAB — CBC WITH DIFFERENTIAL/PLATELET
Abs Immature Granulocytes: 0.03 K/uL (ref 0.00–0.07)
Basophils Absolute: 0 K/uL (ref 0.0–0.1)
Basophils Relative: 0 %
Eosinophils Absolute: 0.2 K/uL (ref 0.0–0.5)
Eosinophils Relative: 2 %
HCT: 42.3 % (ref 36.0–46.0)
Hemoglobin: 14.2 g/dL (ref 12.0–15.0)
Immature Granulocytes: 0 %
Lymphocytes Relative: 26 %
Lymphs Abs: 3 K/uL (ref 0.7–4.0)
MCH: 29.5 pg (ref 26.0–34.0)
MCHC: 33.6 g/dL (ref 30.0–36.0)
MCV: 87.9 fL (ref 80.0–100.0)
Monocytes Absolute: 0.8 K/uL (ref 0.1–1.0)
Monocytes Relative: 7 %
Neutro Abs: 7.6 K/uL (ref 1.7–7.7)
Neutrophils Relative %: 65 %
Platelets: 263 K/uL (ref 150–400)
RBC: 4.81 MIL/uL (ref 3.87–5.11)
RDW: 12.5 % (ref 11.5–15.5)
WBC: 11.6 K/uL — ABNORMAL HIGH (ref 4.0–10.5)
nRBC: 0 % (ref 0.0–0.2)

## 2024-06-16 LAB — HCG, SERUM, QUALITATIVE: Preg, Serum: NEGATIVE

## 2024-06-16 LAB — I-STAT CG4 LACTIC ACID, ED: Lactic Acid, Venous: 0.6 mmol/L (ref 0.5–1.9)

## 2024-06-16 MED ORDER — LIDOCAINE-EPINEPHRINE (PF) 2 %-1:200000 IJ SOLN
INTRAMUSCULAR | Status: AC
  Administered 2024-06-16: 10 mL
  Filled 2024-06-16: qty 20

## 2024-06-16 MED ORDER — FLUCONAZOLE 150 MG PO TABS: 150.0000 mg | ORAL_TABLET | Freq: Once | ORAL | 0 refills | Status: DC

## 2024-06-16 MED ORDER — LACTATED RINGERS IV BOLUS
1000.0000 mL | Freq: Once | INTRAVENOUS | Status: AC
  Administered 2024-06-16: 1000 mL via INTRAVENOUS

## 2024-06-16 MED ORDER — CEPHALEXIN 500 MG PO CAPS: 500.0000 mg | ORAL_CAPSULE | Freq: Four times a day (QID) | ORAL | 0 refills | Status: DC

## 2024-06-16 MED ORDER — FLUCONAZOLE 150 MG PO TABS: 150.0000 mg | ORAL_TABLET | Freq: Once | ORAL | 0 refills | Status: AC

## 2024-06-16 MED ORDER — LIDOCAINE-EPINEPHRINE (PF) 2 %-1:200000 IJ SOLN: 10.0000 mL | Freq: Once | INTRAMUSCULAR | Status: AC

## 2024-06-16 MED ORDER — CEPHALEXIN 500 MG PO CAPS
500.0000 mg | ORAL_CAPSULE | Freq: Once | ORAL | Status: AC
  Administered 2024-06-16: 500 mg via ORAL
  Filled 2024-06-16: qty 1

## 2024-06-16 MED ORDER — LIDOCAINE-EPINEPHRINE 1 %-1:100000 IJ SOLN
10.0000 mL | Freq: Once | INTRAMUSCULAR | Status: DC
  Filled 2024-06-16: qty 10

## 2024-06-16 NOTE — ED Triage Notes (Signed)
 Pt reports with cellulitis to her left inner upper thigh since Friday. Pt was seen a couple a times, on abts, but reports that it has worsened. Pt reports weeping to the area.

## 2024-06-16 NOTE — Telephone Encounter (Signed)
 Copied from CRM [id number]. Topic: General - Call Back - No Documentation >> Jun 15, 2024  4:32 PM Kevelyn M wrote: Reason for CRM: Patient calling back for an appointment Jonetta).  Call back:[phone]

## 2024-06-16 NOTE — ED Provider Notes (Signed)
 [place] EMERGENCY DEPARTMENT AT [facility] Provider Note   CSN: [id number] Arrival date & time: 06/16/24  1410     Patient presents with: Cellulitis   Deborah Summers is a 23 y.o. female.   23 year old female with past medical history of migraine headaches on Relpax and Emgality  presenting to the emergency department today with cellulitis of her left leg.  The patient has been on Bactrim now for the past few days.  Reports that her symptoms have continued to worsen.  She does have an area over the medial aspect of her thigh that is more fluctuant now and has had minimal drainage.  She came to the ER today for further evaluation for this.  Denies any fevers with this.  She states she is having pain.  Denies any significant pain in her buttocks or vaginal region.        Prior to Admission medications   Medication Sig Start Date End Date Taking? Authorizing Provider  cephALEXin (KEFLEX) 500 MG capsule Take 1 capsule (500 mg total) by mouth 4 (four) times daily. 06/16/24  Yes Ula Prentice SAUNDERS, MD  fluconazole  (DIFLUCAN ) 150 MG tablet Take 1 tablet (150 mg total) by mouth once for 1 dose. 06/16/24 06/16/24 Yes Ula Prentice SAUNDERS, MD  buPROPion  (WELLBUTRIN  SR) 150 MG 12 hr tablet Take 2 tablets (300 mg total) by mouth 2 (two) times daily. 12/06/23   Johnson, Megan P, DO  eletriptan (RELPAX) 40 MG tablet     [provider]  Galcanezumab -gnlm (EMGALITY ) 120 MG/ML SOSY Inject 120 mg into the skin every 30 (thirty) days. 08/29/23   Vicci Bouchard P, DO  levonorgestrel (MIRENA) 20 MCG/DAY IUD 1 each by Intrauterine route once. Inserted 07/25/2023    [provider]  ondansetron  (ZOFRAN -ODT) 4 MG disintegrating tablet  07/15/23   [provider]  sulfamethoxazole-trimethoprim (BACTRIM DS) 800-160 MG tablet Take 1 tablet by mouth 2 (two) times daily for 10 days. 06/14/24 06/24/24  Maranda Jamee Jacob, MD  traMADol (ULTRAM) 50 MG tablet Take 1-2 tablets (50-100 mg total)  by mouth 3 (three) times daily as needed. 06/14/24   Maranda Jamee Jacob, MD    Allergies: Tape    Review of Systems  Skin:  Positive for wound.  All other systems reviewed and are negative.   Updated Vital Signs BP 119/80 (BP Location: Left Arm)   Pulse 86   Temp 97.6 F (36.4 C) (Oral)   Resp 17   Ht 5' 9 (1.753 m)   Wt 113.4 kg   SpO2 100%   BMI 36.92 kg/m   Physical Exam Vitals and nursing note reviewed.   Gen: NAD Eyes: PERRL, EOMI HEENT: no oropharyngeal swelling Neck: trachea midline Resp: clear to auscultation bilaterally Card: RRR, no murmurs, rubs, or gallops Abd: nontender, nondistended Extremities: no calf tenderness, no edema Vascular: 2+ radial pulses bilaterally, 2+ DP pulses bilaterally Skin: The patient does have significant erythema noted to the medial aspect of the left thigh with an area of fluctuance in the middle of the area of erythema.  There is no crepitus.  This does not extend to the labia and is relegated to the left thigh.  It does appear that the erythema has gotten a little worse compared to where her skin markings were performed 2 days ago in the emergency department. Psyc: acting appropriately   (all labs ordered are listed, but only abnormal results are displayed) Labs Reviewed  CBC WITH DIFFERENTIAL/PLATELET - Abnormal; Notable for the  following components:      Result Value   WBC 11.6 (*)    All other components within normal limits  COMPREHENSIVE METABOLIC PANEL WITH GFR - Abnormal; Notable for the following components:   Creatinine, Ser 1.10 (*)    All other components within normal limits  HCG, SERUM, QUALITATIVE  I-STAT CG4 LACTIC ACID, ED    EKG: None  Radiology: No results found.   .Incision and Drainage  Date/Time: 06/16/2024 5:10 PM  Performed by: Ula Prentice SAUNDERS, MD Authorized by: Ula Prentice SAUNDERS, MD   Consent:    Consent obtained:  Verbal   Consent given by:  Patient   Risks, benefits, and alternatives were  discussed: yes     Risks discussed:  Bleeding, infection, incomplete drainage and pain   Alternatives discussed:  No treatment Universal protocol:    Procedure explained and questions answered to patient or proxy's satisfaction: yes     Immediately prior to procedure, a time out was called: yes     Patient identity confirmed:  Verbally with patient Location:    Type:  Abscess   Size:  5   Location:  Lower extremity   Lower extremity location:  Leg   Leg location:  L upper leg Pre-procedure details:    Skin preparation:  Chlorhexidine Sedation:    Sedation type:  None Anesthesia:    Anesthesia method:  Topical application and local infiltration   Local anesthetic:  Lidocaine  2% WITH epi Procedure type:    Complexity:  Simple Procedure details:    Ultrasound guidance: no     Incision types:  Single straight   Incision depth:  Dermal   Wound management:  Probed and deloculated and irrigated with saline   Drainage:  Purulent   Drainage amount:  Moderate   Wound treatment:  Wound left open   Packing materials:  None Post-procedure details:    Procedure completion:  Tolerated .Ultrasound ED Soft Tissue  Date/Time: 06/16/2024 5:11 PM  Performed by: Ula Prentice SAUNDERS, MD Authorized by: Ula Prentice SAUNDERS, MD   Procedure details:    Indications: localization of abscess     Transverse view:  Visualized   Longitudinal view:  Not visualized   Images: archived   Location:    Location: lower extremity     Side:  Left Findings:     abscess present    cellulitis present    no foreign body present    Medications Ordered in the ED  lactated ringers  bolus 1,000 mL (0 mLs Intravenous Stopped 06/16/24 1702)  lidocaine -EPINEPHrine (XYLOCAINE  W/EPI) 2 %-1:200000 (PF) injection 10 mL (10 mLs Infiltration Given by Other 06/16/24 1647)  cephALEXin (KEFLEX) capsule 500 mg (500 mg Oral Given 06/16/24 1702)                                    Medical Decision Making 23 year old female with  past medical history of migraine headaches presenting to the emergency department today with concern for cellulitis and abscess of left upper extremity.  Incision and drainage was performed here.  Labs were obtained and the patient's white blood cell count and lactic acid are downtrending and within normal limits.  The patient's heart rate improved with IV fluids.  She did have improvement in her symptoms after incision and drainage.  I do think that she is safe for discharge.  She is given Keflex here will be started on Keflex  in addition to the Bactrim she is already on.  He is encouraged to follow-up with her doctor and return to the ER for worsening symptoms.  Exam today is not consistent with necrotizing fasciitis or deep space infection.  Amount and/or Complexity of Data Reviewed Labs: ordered.  Risk Prescription drug management.        Final diagnoses:  Cellulitis and abscess of left leg    ED Discharge Orders          Ordered    cephALEXin (KEFLEX) 500 MG capsule  4 times daily        06/16/24 1643    fluconazole  (DIFLUCAN ) 150 MG tablet   Once        06/16/24 1643               Ula Prentice SAUNDERS, MD 06/16/24 1712

## 2024-06-16 NOTE — Discharge Instructions (Signed)
 Your abscess on your leg was drained today.  I think that the cellulitis of your leg should improve significantly with this.  Please take the Keflex in addition to the Bactrim that you are on.  Please continue to take these to completion.  If you develop any symptoms consistent with yeast infection take the Diflucan .  If you have worsening symptoms please return to the emergency department for reevaluation.

## 2024-06-23 ENCOUNTER — Ambulatory Visit: Admitting: Family Medicine

## 2024-06-23 ENCOUNTER — Encounter: Payer: Self-pay | Admitting: Family Medicine

## 2024-06-23 ENCOUNTER — Inpatient Hospital Stay: Admitting: Nurse Practitioner

## 2024-06-23 VITALS — BP 107/67 | HR 87 | Temp 98.0°F | Ht 69.0 in | Wt 256.0 lb

## 2024-06-23 DIAGNOSIS — L03116 Cellulitis of left lower limb: Secondary | ICD-10-CM

## 2024-06-23 MED ORDER — CEPHALEXIN 500 MG PO CAPS
500.0000 mg | ORAL_CAPSULE | Freq: Four times a day (QID) | ORAL | 0 refills | Status: AC
Start: 2024-06-23 — End: ?

## 2024-06-23 MED ORDER — SULFAMETHOXAZOLE-TRIMETHOPRIM 800-160 MG PO TABS: 1.0000 | ORAL_TABLET | Freq: Two times a day (BID) | ORAL | 0 refills | Status: AC

## 2024-06-23 NOTE — Progress Notes (Signed)
 BP 107/67   Pulse 87   Temp 98 F (36.7 C) (Oral)   Ht 5' 9 (1.753 m)   Wt 256 lb (116.1 kg)   SpO2 98%   BMI 37.80 kg/m    Subjective:    Patient ID: Deborah Summers, female    DOB: 10/26/2000, 23 y.o.   MRN: [id number]  HPI: Pattricia M Summers is a 23 y.o. female  Chief Complaint  Patient presents with   Follow-up    ED 06/16/24 FU cellulitis    SKIN INFECTION Duration: about 2 weeks Location: L inner thigh History of trauma in area: yes Pain: no Quality: aching Severity: mild Redness: no Swelling: yes Oozing: no Pus: no Fevers: no Nausea/vomiting: no Status: better Treatments attempted:antibiotics and I&D  Tetanus: UTD  Relevant past medical, surgical, family and social history reviewed and updated as indicated. Interim medical history since our last visit reviewed. Allergies and medications reviewed and updated.  Review of Systems  Constitutional: Negative.   Respiratory: Negative.    Musculoskeletal: Negative.   Skin:  Positive for wound. Negative for color change, pallor and rash.  Psychiatric/Behavioral: Negative.      Per HPI unless specifically indicated above     Objective:    BP 107/67   Pulse 87   Temp 98 F (36.7 C) (Oral)   Ht 5' 9 (1.753 m)   Wt 256 lb (116.1 kg)   SpO2 98%   BMI 37.80 kg/m   Wt Readings from Last 3 Encounters:  06/23/24 256 lb (116.1 kg)  06/16/24 250 lb (113.4 kg)  06/15/24 250 lb (113.4 kg)    Physical Exam Vitals and nursing note reviewed.  Constitutional:      General: She is not in acute distress.    Appearance: Normal appearance. She is not ill-appearing, toxic-appearing or diaphoretic.  HENT:     Head: Normocephalic and atraumatic.     Right Ear: External ear normal.     Left Ear: External ear normal.     Nose: Nose normal.     Mouth/Throat:     Mouth: Mucous membranes are moist.     Pharynx: Oropharynx is clear.  Eyes:     General: No scleral icterus.       Right eye: No discharge.        Left  eye: No discharge.     Extraocular Movements: Extraocular movements intact.     Conjunctiva/sclera: Conjunctivae normal.     Pupils: Pupils are equal, round, and reactive to light.  Cardiovascular:     Rate and Rhythm: Normal rate and regular rhythm.     Pulses: Normal pulses.     Heart sounds: Normal heart sounds. No murmur heard.    No friction rub. No gallop.  Pulmonary:     Effort: Pulmonary effort is normal. No respiratory distress.     Breath sounds: Normal breath sounds. No stridor. No wheezing, rhonchi or rales.  Chest:     Chest wall: No tenderness.  Musculoskeletal:        General: Normal range of motion.     Cervical back: Normal range of motion and neck supple.  Skin:    General: Skin is warm and dry.     Capillary Refill: Capillary refill takes less than 2 seconds.     Coloration: Skin is not jaundiced or pale.     Findings: No bruising, erythema, lesion or rash.     Comments: Open wound on L inner thigh- no redness,  still some induration  Neurological:     General: No focal deficit present.     Mental Status: She is alert and oriented to person, place, and time. Mental status is at baseline.  Psychiatric:        Mood and Affect: Mood normal.        Behavior: Behavior normal.        Thought Content: Thought content normal.        Judgment: Judgment normal.     Results for orders placed or performed during the hospital encounter of 06/16/24  CBC with Differential/Platelet   Collection Time: 06/16/24  2:14 PM  Result Value Ref Range   WBC 11.6 (H) 4.0 - 10.5 K/uL   RBC 4.81 3.87 - 5.11 MIL/uL   Hemoglobin 14.2 12.0 - 15.0 g/dL   HCT 57.6 63.9 - 53.9 %   MCV 87.9 80.0 - 100.0 fL   MCH 29.5 26.0 - 34.0 pg   MCHC 33.6 30.0 - 36.0 g/dL   RDW 87.4 88.4 - 84.4 %   Platelets 263 150 - 400 K/uL   nRBC 0.0 0.0 - 0.2 %   Neutrophils Relative % 65 %   Neutro Abs 7.6 1.7 - 7.7 K/uL   Lymphocytes Relative 26 %   Lymphs Abs 3.0 0.7 - 4.0 K/uL   Monocytes Relative 7 %    Monocytes Absolute 0.8 0.1 - 1.0 K/uL   Eosinophils Relative 2 %   Eosinophils Absolute 0.2 0.0 - 0.5 K/uL   Basophils Relative 0 %   Basophils Absolute 0.0 0.0 - 0.1 K/uL   Immature Granulocytes 0 %   Abs Immature Granulocytes 0.03 0.00 - 0.07 K/uL  hCG, serum, qualitative   Collection Time: 06/16/24  2:14 PM  Result Value Ref Range   Preg, Serum NEGATIVE NEGATIVE  Comprehensive metabolic panel   Collection Time: 06/16/24  2:14 PM  Result Value Ref Range   Sodium 140 135 - 145 mmol/L   Potassium 3.8 3.5 - 5.1 mmol/L   Chloride 107 98 - 111 mmol/L   CO2 22 22 - 32 mmol/L   Glucose, Bld 79 70 - 99 mg/dL   BUN 10 6 - 20 mg/dL   Creatinine, Ser 8.89 (H) 0.44 - 1.00 mg/dL   Calcium 9.6 8.9 - 89.6 mg/dL   Total Protein 7.8 6.5 - 8.1 g/dL   Albumin 4.2 3.5 - 5.0 g/dL   AST 19 15 - 41 U/L   ALT 11 0 - 44 U/L   Alkaline Phosphatase 111 38 - 126 U/L   Total Bilirubin 0.4 0.0 - 1.2 mg/dL   GFR, Estimated >39 >39 mL/min   Anion gap 11 5 - 15  I-Stat CG4 Lactic Acid   Collection Time: 06/16/24  3:19 PM  Result Value Ref Range   Lactic Acid, Venous 0.6 0.5 - 1.9 mmol/L      Assessment & Plan:   Problem List Items Addressed This Visit   None Visit Diagnoses       Cellulitis of left lower extremity    -  Primary   Nearly resolved. Will continue antibiotics for another 3 days- call with any concerns or if not entirely resolved.        Follow up plan: Return if symptoms worsen or fail to improve.

## 2024-06-24 ENCOUNTER — Telehealth: Payer: Self-pay

## 2024-06-24 NOTE — Progress Notes (Signed)
   06/24/2024  Patient ID: Deborah Summers, female   DOB: Oct 17, 2000, 23 y.o.   MRN: [id number]  PA for Emgality  has been approved by insurance.  Contacted patient to let her know this should be able to be processed at [facility] now.  I was not able to reach her, but did leave a message with this information (DPR allows detailed message on mobile #) along with my direct phone number in case there are any issues getting the refill  Lister Brizzi A Kory Panjwani, PharmD, DPLA

## 2024-06-24 NOTE — Progress Notes (Signed)
   06/24/2024  Patient ID: Deborah Summers, female   DOB: 2001-02-06, 23 y.o.   MRN: [id number]  In basket message from patient's PCP stating patient has not been able to recently refill Emgality  120mg .  It appears the medication needs a new PA on file with her insurance.  I have submitted a PA request to the insurance via CoverMyMeds (KEY [id number]).  I will monitor status to keep patient and provider informed.  Channing DELENA Mealing, PharmD, DPLA

## 2024-06-25 NOTE — Progress Notes (Signed)
   06/25/2024  Patient ID: Deborah Summers, female   DOB: [birth year], 23 y.o.   MRN: [id number]  No response from patient, but I contacted [facility]; and Emgality  is going through on insurance and being filled now.  Channing DELENA Mealing, PharmD, DPLA

## 2024-07-01 ENCOUNTER — Other Ambulatory Visit: Payer: Self-pay | Admitting: Family Medicine

## 2024-07-03 NOTE — Telephone Encounter (Signed)
 Requested Prescriptions  Pending Prescriptions Disp Refills   buPROPion  (WELLBUTRIN  SR) 150 MG 12 hr tablet [Pharmacy Med Name: BUPROPION  HCL SR 150 MG TABLET] 360 tablet 0    Sig: TAKE 2 TABLETS BY MOUTH 2 TIMES DAILY.     Psychiatry: Antidepressants - bupropion  Failed - 07/03/2024  3:18 PM      Failed - Cr in normal range and within 360 days    Creatinine, Ser  Date Value Ref Range Status  06/16/2024 1.10 (H) 0.44 - 1.00 mg/dL Final         Passed - AST in normal range and within 360 days    AST  Date Value Ref Range Status  06/16/2024 19 15 - 41 U/L Final         Passed - ALT in normal range and within 360 days    ALT  Date Value Ref Range Status  06/16/2024 11 0 - 44 U/L Final         Passed - Last BP in normal range    BP Readings from Last 1 Encounters:  06/23/24 107/67         Passed - Valid encounter within last 6 months    Recent Outpatient Visits           1 week ago Cellulitis of left lower extremity   [place] [facility] [place], Megan P, DO   3 months ago Morbid obesity [facility])   [place] [facility] [place], Megan P, DO   7 months ago Morbid obesity [facility])   [place] [facility] [place], Megan P, DO   8 months ago Morbid obesity [facility])   [place] [facility] [place], Duwaine SQUIBB, DO       Future Appointments             In 2 months Vicci, Duwaine SQUIBB, DO [place] [facility], [address]

## 2024-09-01 ENCOUNTER — Encounter: Payer: Self-pay | Admitting: Family Medicine

## 2024-09-02 ENCOUNTER — Other Ambulatory Visit: Payer: Self-pay | Admitting: Family Medicine

## 2024-10-16 ENCOUNTER — Encounter: Admitting: Family Medicine
